# Patient Record
Sex: Male | Born: 1980 | Race: White | Hispanic: No | Smoking: Never smoker
Health system: Southern US, Community
[De-identification: ages and names within clinical notes are randomized; demographics above are authoritative.]

## PROBLEM LIST (undated history)

## (undated) DIAGNOSIS — I10 Essential (primary) hypertension: Secondary | ICD-10-CM

## (undated) DIAGNOSIS — R61 Generalized hyperhidrosis: Secondary | ICD-10-CM

## (undated) HISTORY — DX: Essential (primary) hypertension: I10

## (undated) HISTORY — DX: Generalized hyperhidrosis: R61

## (undated) HISTORY — PX: VASECTOMY: SHX75

---

## 1999-03-13 HISTORY — PX: OTHER SURGICAL HISTORY: SHX169

## 2001-01-10 ENCOUNTER — Encounter: Payer: Self-pay | Admitting: Family Medicine

## 2001-01-10 ENCOUNTER — Ambulatory Visit (HOSPITAL_COMMUNITY): Admission: RE | Admit: 2001-01-10 | Discharge: 2001-01-10 | Payer: Self-pay | Admitting: Family Medicine

## 2001-02-27 ENCOUNTER — Ambulatory Visit (HOSPITAL_BASED_OUTPATIENT_CLINIC_OR_DEPARTMENT_OTHER): Admission: RE | Admit: 2001-02-27 | Discharge: 2001-02-27 | Payer: Self-pay | Admitting: Orthopedic Surgery

## 2004-03-14 ENCOUNTER — Ambulatory Visit: Payer: Self-pay | Admitting: Internal Medicine

## 2004-12-08 ENCOUNTER — Ambulatory Visit: Payer: Self-pay | Admitting: Internal Medicine

## 2006-01-03 ENCOUNTER — Ambulatory Visit: Payer: Self-pay | Admitting: Internal Medicine

## 2013-06-08 ENCOUNTER — Other Ambulatory Visit: Payer: Self-pay | Admitting: Family Medicine

## 2013-06-08 DIAGNOSIS — E041 Nontoxic single thyroid nodule: Secondary | ICD-10-CM

## 2013-06-10 ENCOUNTER — Other Ambulatory Visit: Payer: Self-pay

## 2013-06-11 ENCOUNTER — Ambulatory Visit
Admission: RE | Admit: 2013-06-11 | Discharge: 2013-06-11 | Disposition: A | Discharge status: Home / Self Care | Payer: BC Managed Care – PPO | Source: Ambulatory Visit | Attending: Family Medicine | Admitting: Family Medicine

## 2013-06-11 DIAGNOSIS — E041 Nontoxic single thyroid nodule: Secondary | ICD-10-CM

## 2013-06-15 ENCOUNTER — Other Ambulatory Visit: Payer: Self-pay | Admitting: Family Medicine

## 2013-06-15 DIAGNOSIS — E041 Nontoxic single thyroid nodule: Secondary | ICD-10-CM

## 2013-06-23 ENCOUNTER — Ambulatory Visit
Admission: RE | Admit: 2013-06-23 | Discharge: 2013-06-23 | Disposition: A | Discharge status: Home / Self Care | Payer: BC Managed Care – PPO | Source: Ambulatory Visit | Attending: Family Medicine | Admitting: Family Medicine

## 2013-06-23 ENCOUNTER — Other Ambulatory Visit (HOSPITAL_COMMUNITY)
Admission: RE | Admit: 2013-06-23 | Discharge: 2013-06-23 | Disposition: A | Discharge status: Home / Self Care | Payer: BC Managed Care – PPO | Source: Ambulatory Visit | Attending: Interventional Radiology | Admitting: Interventional Radiology

## 2013-06-23 DIAGNOSIS — E041 Nontoxic single thyroid nodule: Secondary | ICD-10-CM | POA: Insufficient documentation

## 2015-04-03 IMAGING — US US SOFT TISSUE HEAD/NECK
1 series · 13 of 25 positions shown · non-contrast
Comparison: None.

CLINICAL DATA: Palpable area in the right neck clinically

EXAM:
THYROID ULTRASOUND
TECHNIQUE: Ultrasound examination of the thyroid gland and adjacent soft
tissues was performed.

[Series 1: us soft tissue head/neck · 0.06mm/px · 13 of 46 slices shown]
[im 1/46]
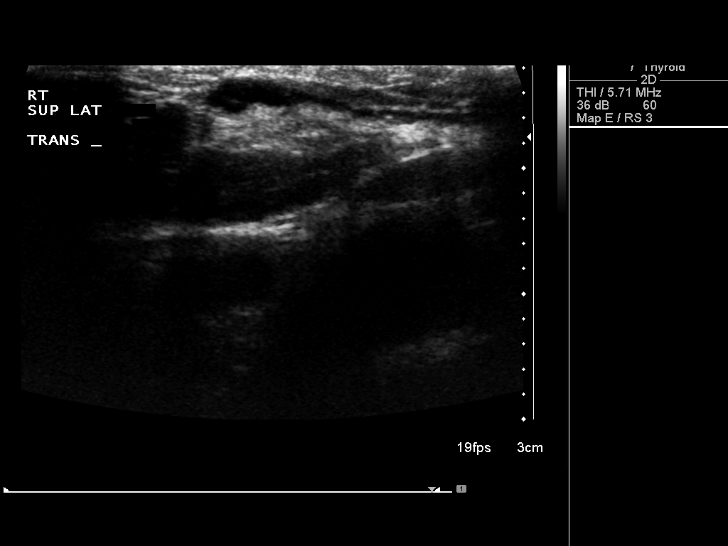
[im 4/46]
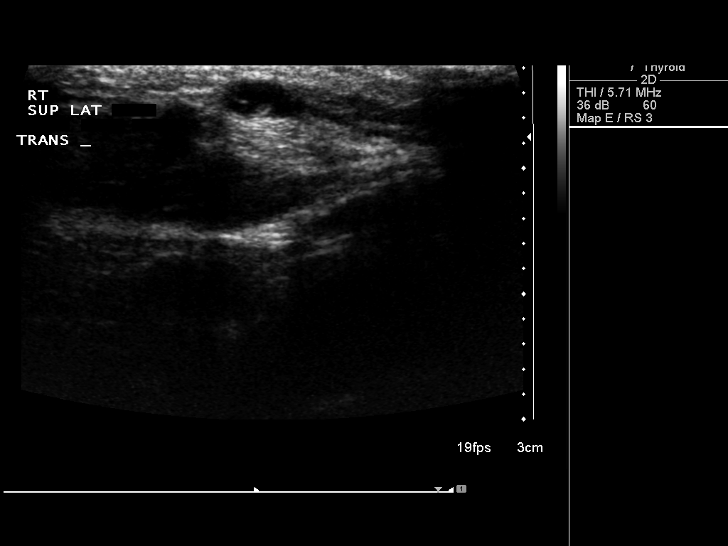
[im 8/46]
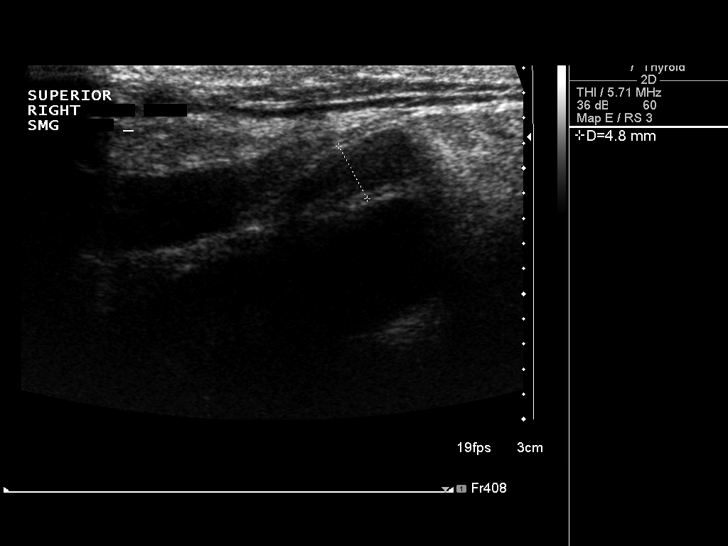
[im 12/46]
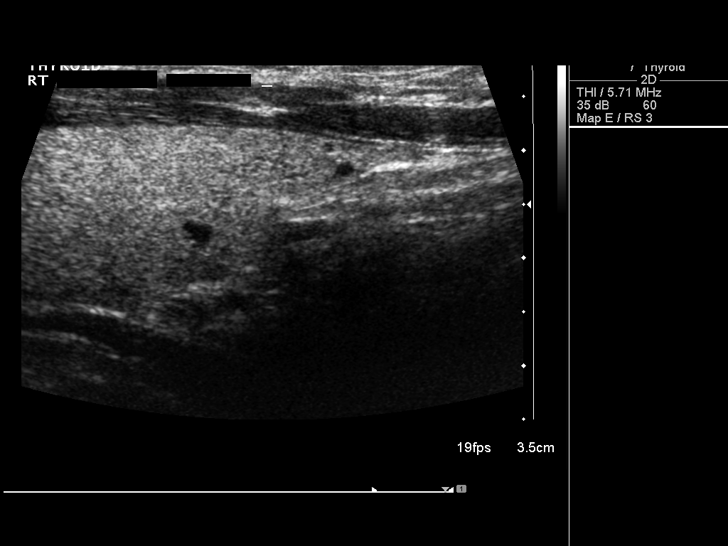
[im 16/46]
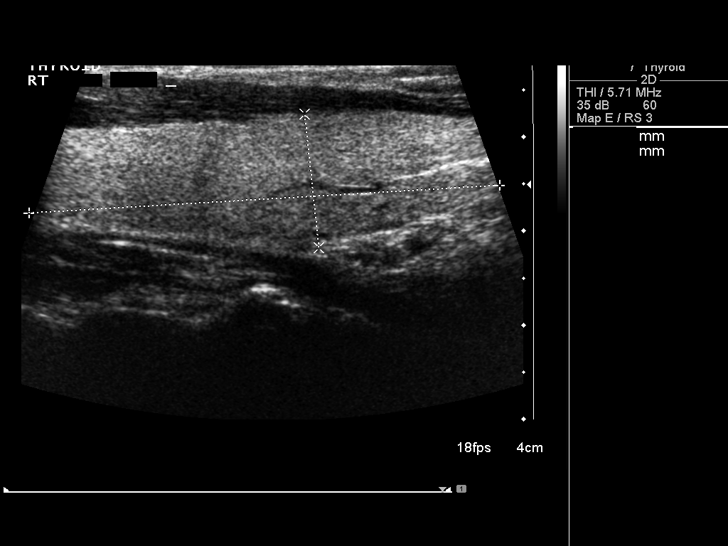
[im 19/46]
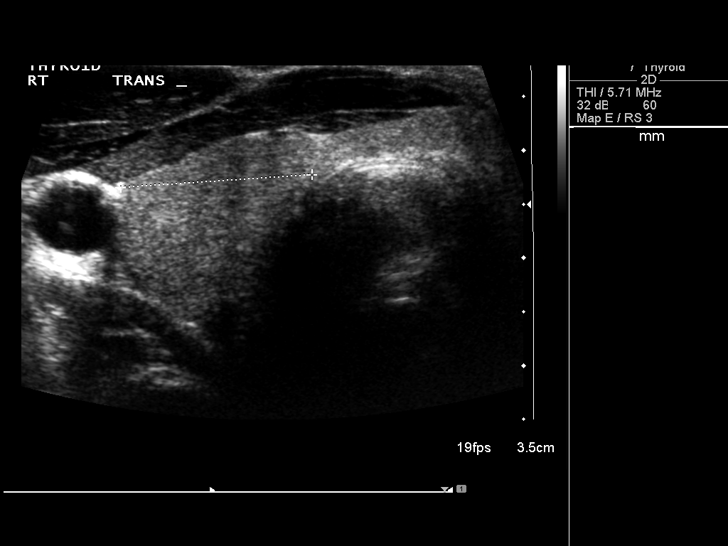
[im 23/46]
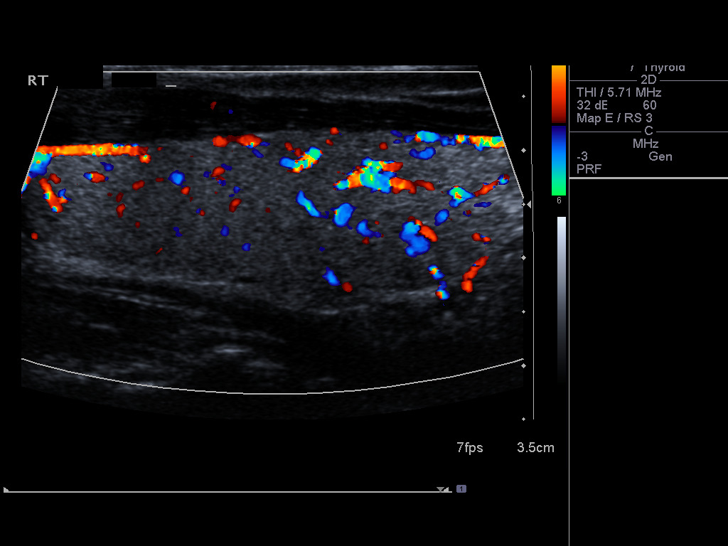
[im 27/46]
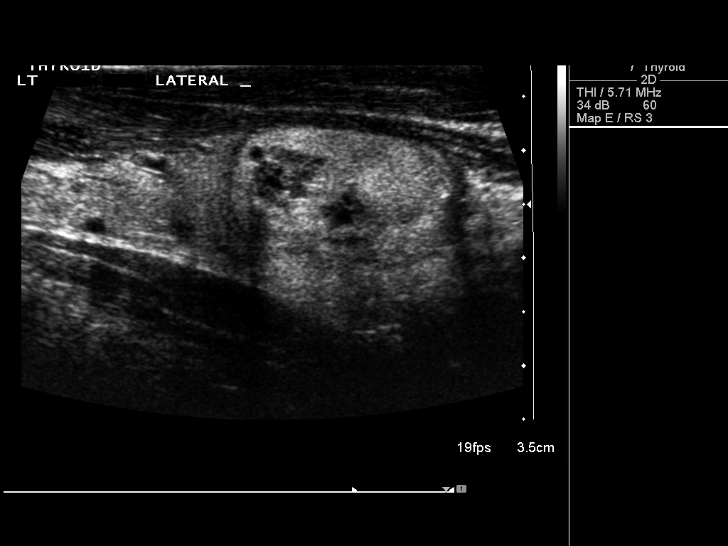
[im 31/46]
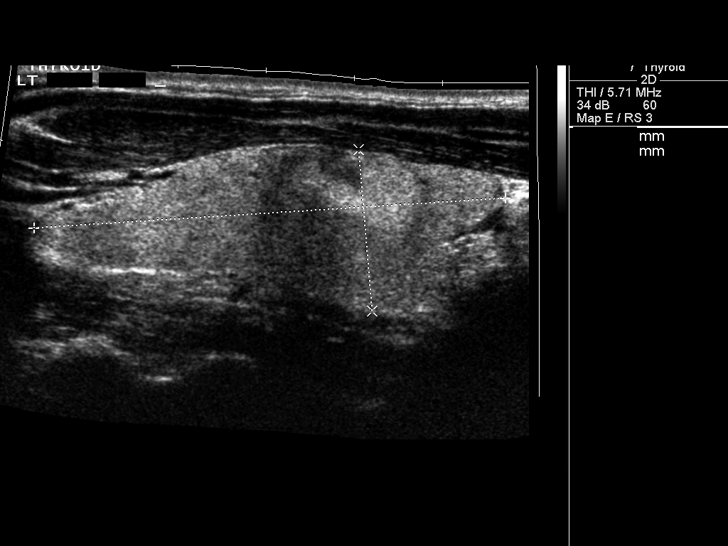
[im 34/46]
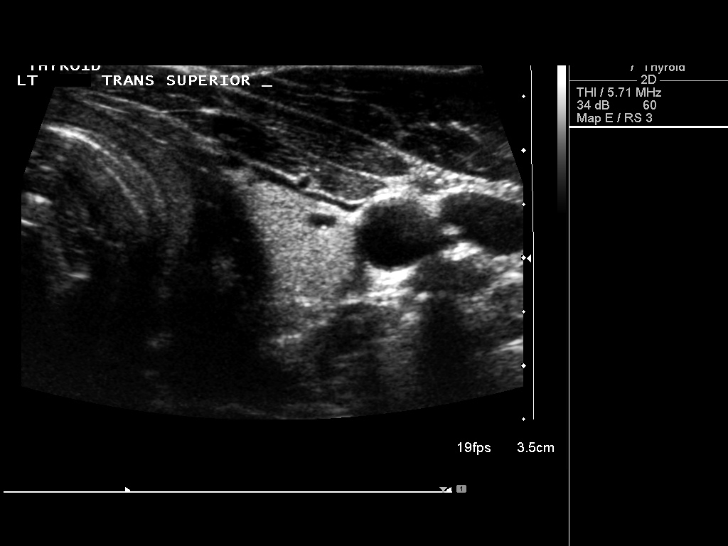
[im 38/46]
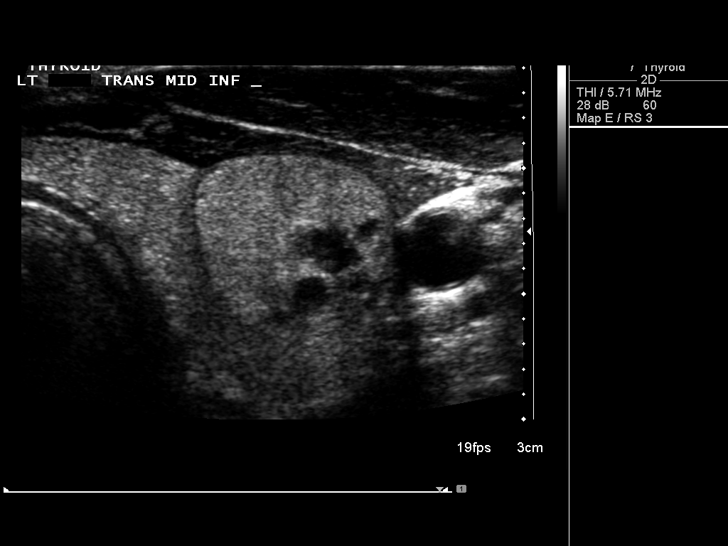
[im 42/46]
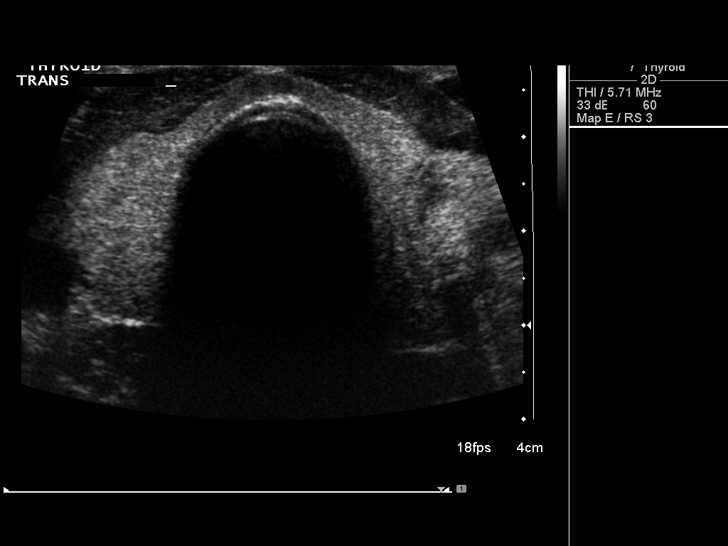
[im 46/46]
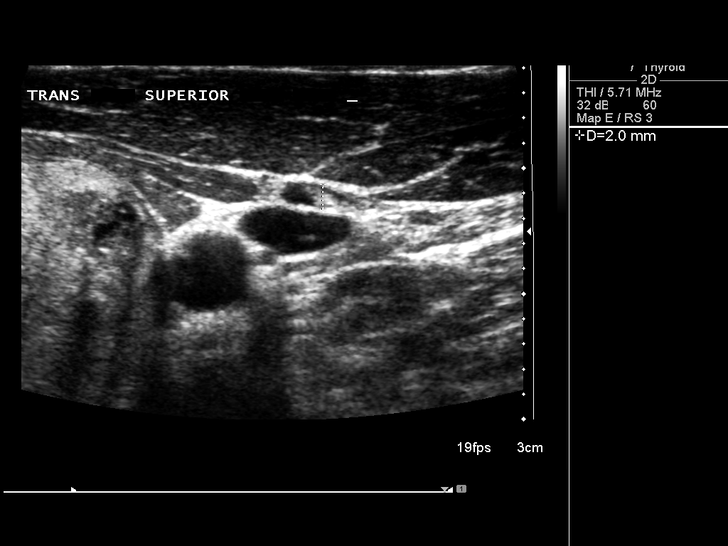

[13 of 25 positions shown; findings below may reference images not displayed]

FINDINGS: Right thyroid lobe

Measurements: 5.3 x 1.8 x 1.8 cm.. No definite right thyroid nodule
is seen.

Left thyroid lobe

Measurements: 5.3 x 1.8 x 2.2 cm.. A complex somewhat inhomogeneous
nodule which is primarily solid is present within the left mid lower
lobe of 2.3 x 1.5 x 1.6 cm. Biopsy of this nodule is recommended.

Isthmus

Thickness: 4 mm in thickness..  No nodules visualized.

Lymphadenopathy

At the site questioned clinically in the right superior lateral neck
there is a lymph node present of 3.8 mm in short axis diameter with
no pathological features.
IMPRESSION: 1. Complex primarily solid inhomogeneous nodule within the left lobe
of thyroid of 2.3 cm. Findings meet consensus criteria for biopsy.
Ultrasound-guided fine needle aspiration should be considered, as
per the consensus statement: Management of Thyroid Nodules Detected
at US: Society of Radiologists in Ultrasound Consensus Conference
2. At the site questioned clinically only a small relatively normal
appearing lymph node is present in the right superior lateral neck.

## 2015-04-15 IMAGING — US US THYROID BIOPSY
1 series · 12 of 12 positions shown · non-contrast
Comparison: None.

CLINICAL DATA: Dominant left nodule

EXAM:
ULTRASOUND GUIDED NEEDLE ASPIRATE BIOPSY OF THE THYROID GLAND

[Series 1: us thyroid biopsy · 0.07mm/px · 12 acquisitions, 12 frames shown]
[im 1/12]
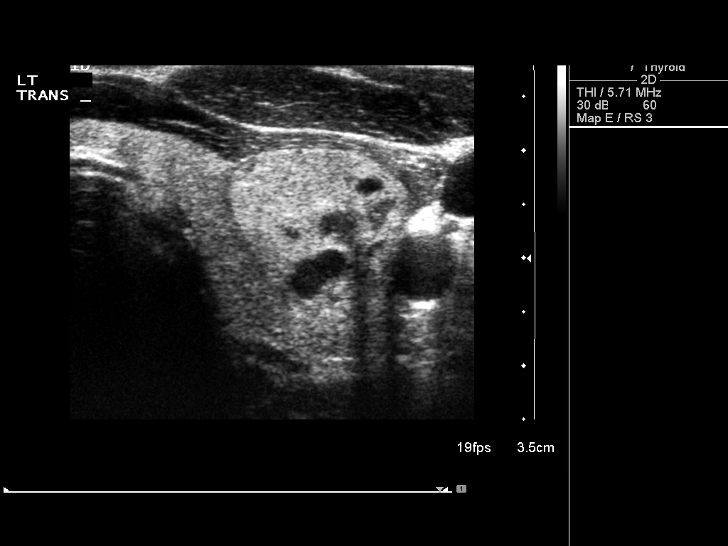
[im 2/12]
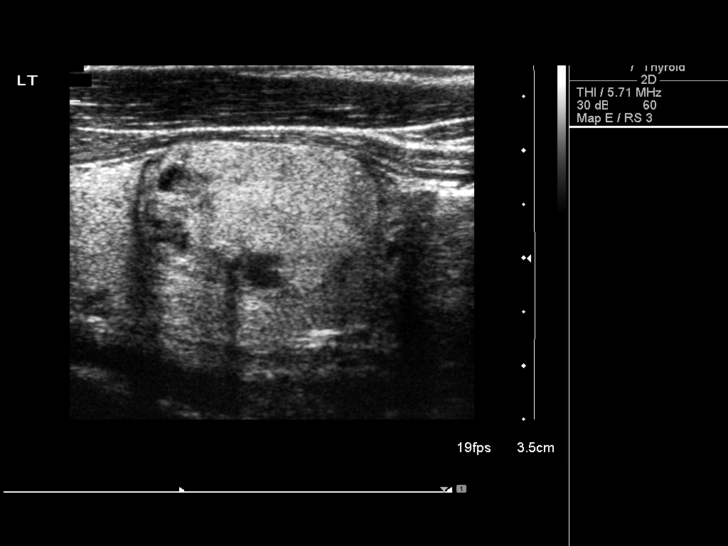
[im 3/12]
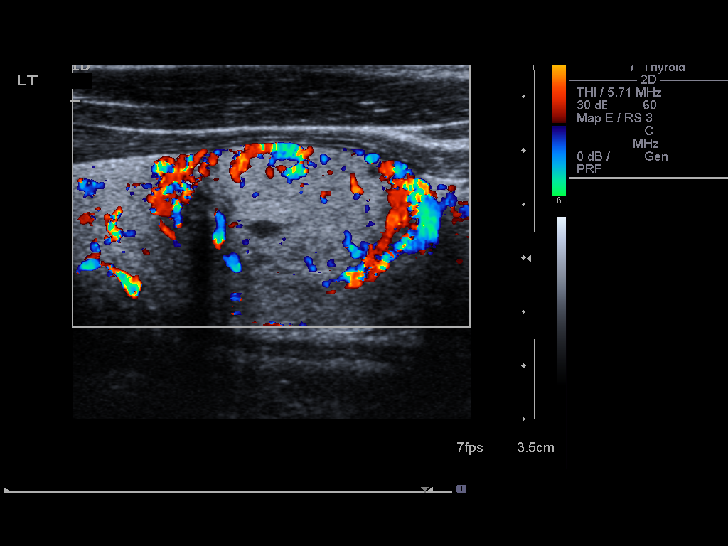
[im 4/12]
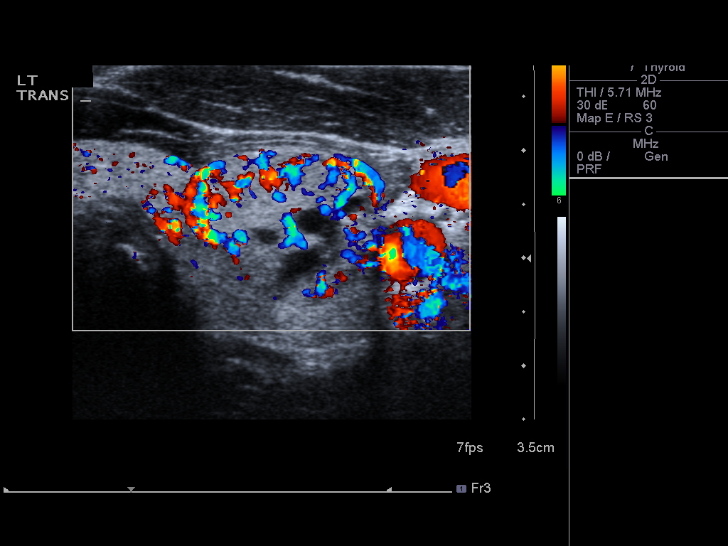
[im 5/12]
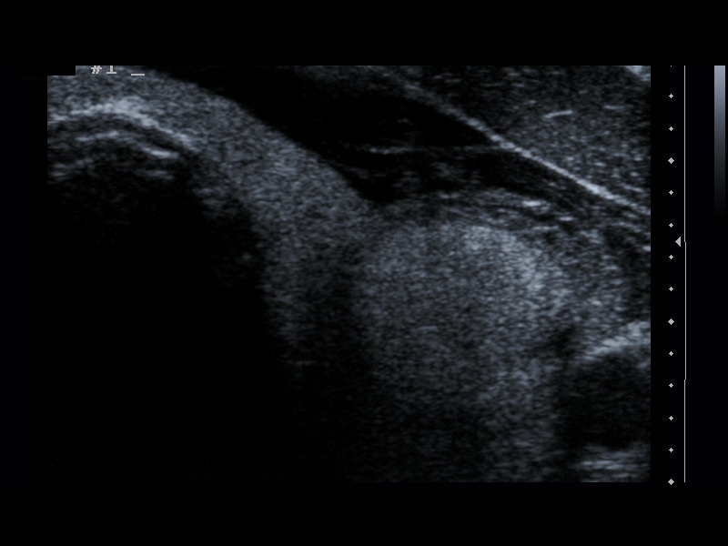
[im 6/12]
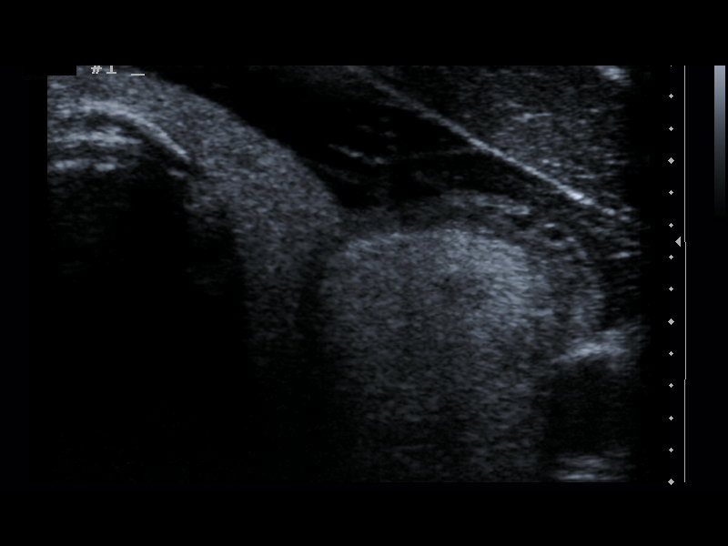
[im 7/12]
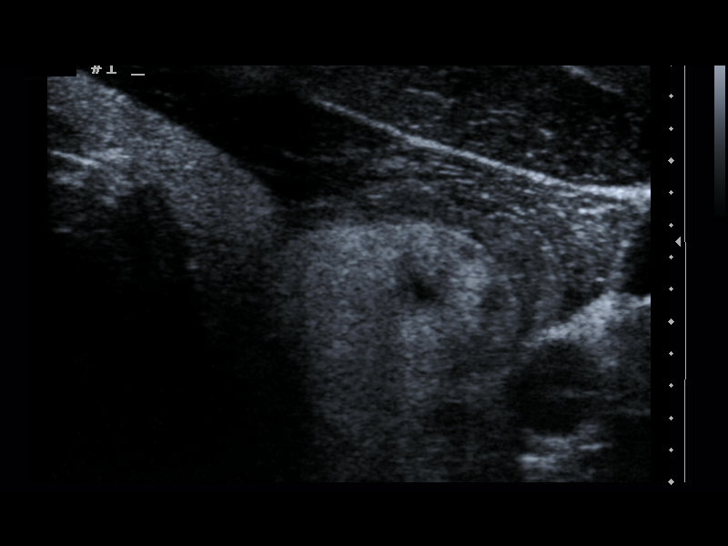
[im 8/12]
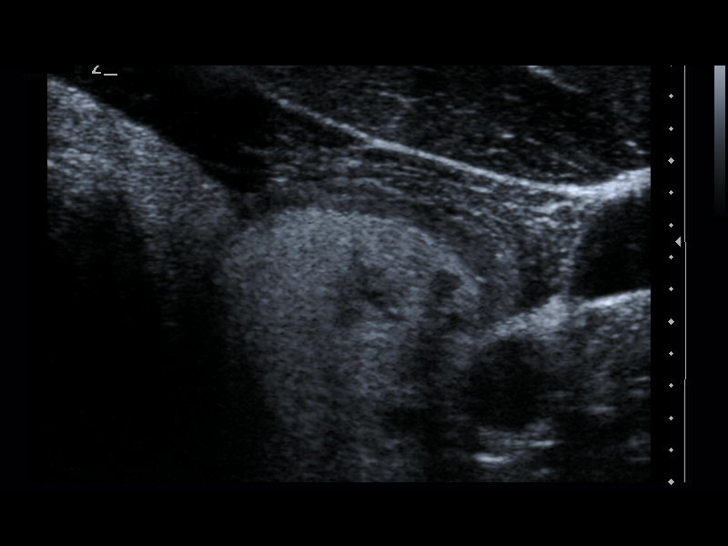
[im 9/12]
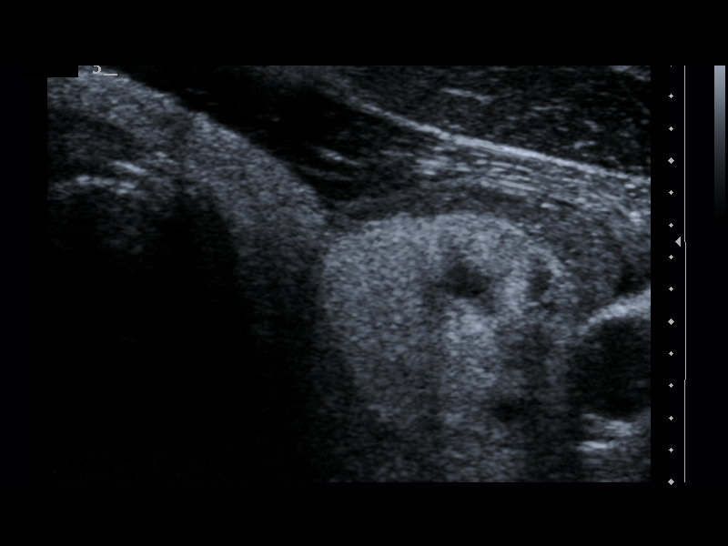
[im 10/12]
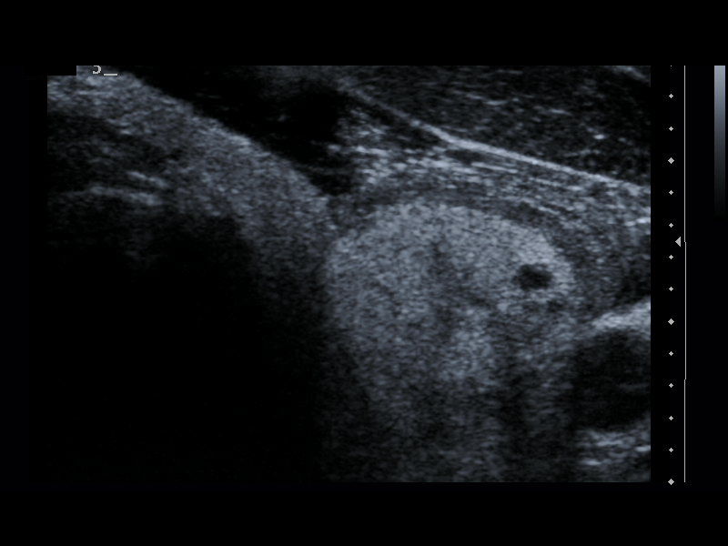
[im 11/12]
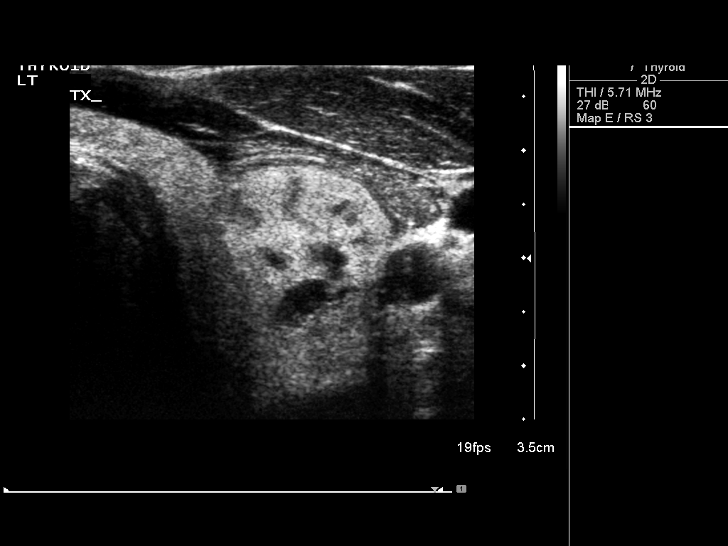
[im 12/12]
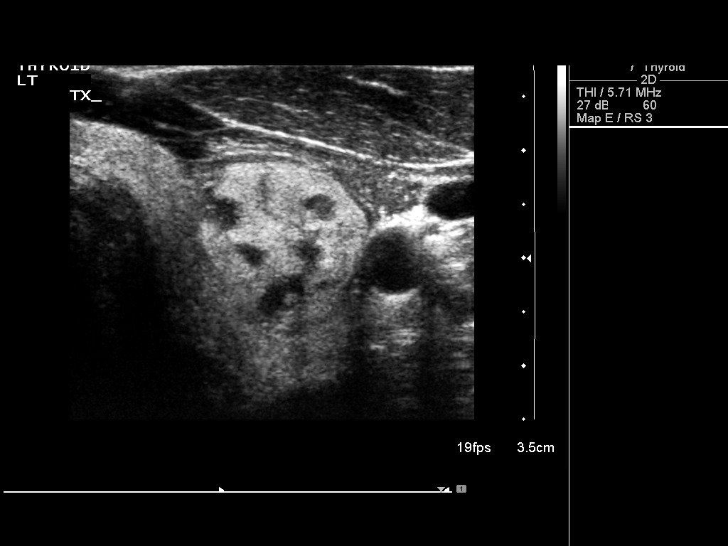

[12 of 12 positions shown; findings below may reference images not displayed]

PROCEDURE:
Thyroid biopsy was thoroughly discussed with the patient and
questions were answered. The benefits, risks, alternatives, and
complications were also discussed. The patient understands and
wishes to proceed with the procedure. Written consent was obtained.

Ultrasound was performed to localize and mark an adequate site for
the biopsy. The patient was then prepped and draped in a normal
sterile fashion. Local anesthesia was provided with 1% lidocaine.
Using direct ultrasound guidance, 3 passes were made using needles
into the nodule within the left lobe of the thyroid. Ultrasound was
used to confirm needle placements on all occasions. Specimens were
sent to Pathology for analysis.

Complications:  None
FINDINGS: Images document needle placement in the left thyroid nodule.
IMPRESSION: Ultrasound guided needle aspirate biopsy performed of the left
thyroid nodule.

## 2016-08-07 ENCOUNTER — Telehealth: Payer: Self-pay

## 2016-08-07 NOTE — Telephone Encounter (Signed)
Notes sent to scheduling.   

## 2016-08-10 ENCOUNTER — Encounter: Payer: Self-pay | Admitting: Cardiology

## 2016-08-10 ENCOUNTER — Encounter (INDEPENDENT_AMBULATORY_CARE_PROVIDER_SITE_OTHER): Payer: Self-pay

## 2016-08-10 ENCOUNTER — Ambulatory Visit (INDEPENDENT_AMBULATORY_CARE_PROVIDER_SITE_OTHER): Payer: BLUE CROSS/BLUE SHIELD | Admitting: Cardiology

## 2016-08-10 VITALS — BP 140/84 | HR 63 | Ht 68.25 in | Wt 216.0 lb

## 2016-08-10 DIAGNOSIS — E7800 Pure hypercholesterolemia, unspecified: Secondary | ICD-10-CM

## 2016-08-10 DIAGNOSIS — R0789 Other chest pain: Secondary | ICD-10-CM | POA: Diagnosis not present

## 2016-08-10 DIAGNOSIS — E669 Obesity, unspecified: Secondary | ICD-10-CM

## 2016-08-10 NOTE — Patient Instructions (Signed)
Medication Instructions:  The current medical regimen is effective;  continue present plan and medications.  Testing/Procedures: Your physician has requested that you have an exercise tolerance test. For further information please visit [email or web address]. Please also follow instruction sheet, as given.  Follow-Up: Follow up will be based on the results of the above testing.  Thank you for choosing [facility]!!     

## 2016-08-10 NOTE — Progress Notes (Addendum)
Cardiology Office Note:    Date:  08/10/2016   ID:  Erik King, DOB [birth year], MRN [id number]  PCP:  Shirlean MylarWebb, Carol, MD  Cardiologist:  Erik SchultzMark Trevone Prestwood, MD    Referring MD: Shirlean MylarWebb, Carol, MD     History of Present Illness:    Erik King is a 36 y.o. male is here for evaluation of chest pain at the request of Dr. Hyman King  In review of notes from 08/03/16, he was concerned about the possibility of having heart disease. He feels some fluttering feelings in his left chest. Weird feeling, left chest not constant, piecing pressure, feel something. It can happen when he sitting at his desk, working on the computer, doing the dishes. Sometimes it just is nagging. He can point to the spot on his chest where he feels this. Boss, head of [employer], Mr. Erik CloudKevin King at age 89 had widow maker encouraged him to make sure he is safe. He has been under increased stress, wife has been battling an illness.  Father at 6564 had afib 4 years ago. Grandfather MI died later in life.   He is a nonsmoker. Has 4 children. Normal kidney liver and electrolytes noted. LDL 110, has been taking Crestor. He is also been treated with lisinopril for high blood pressure.  Past Medical History:  Diagnosis Date  . Hyperhidrosis   . Hypertension     Past Surgical History:  Procedure Laterality Date  . RIGHT ANKLE SURGERY Right 2001  . VASECTOMY      Current Medications: Current Meds  Medication Sig  . aluminum chloride (DRYSOL) 20 % external solution Apply topically at bedtime.  . clindamycin (CLEOCIN T) 1 % SWAB Apply topically 2 (two) times daily.  . clobetasol cream (TEMOVATE) 0.05 % Apply 1 application topically daily as needed.  Marland Kitchen. lisinopril (PRINIVIL,ZESTRIL) 40 MG tablet Take 40 mg by mouth daily.  Marland Kitchen. omeprazole (PRILOSEC) 40 MG capsule Take 40 mg by mouth daily.  . rosuvastatin (CRESTOR) 10 MG tablet Take 10 mg by mouth daily.     Allergies:   Hctz [hydrochlorothiazide]; Metoprolol; and Neosporin  [neomycin-bacitracin zn-polymyx]   Social History   Social History  . Marital status: Married    Spouse name: N/A  . Number of children: N/A  . Years of education: N/A   Social History Main Topics  . Smoking status: Never Smoker  . Smokeless tobacco: Never Used  . Alcohol use Yes     Comment: 6-10 DRINKS PER WEEK  . Drug use: Unknown  . Sexual activity: Not Asked     Comment: MARRIED   Other Topics Concern  . None   Social History Narrative  . None     Family History: The patient's family history includes Cancer - Prostate in his father; Diabetes in his paternal grandfather; GI problems in his brother; Hyperlipidemia in his father; Hypertension in his father, mother, and paternal grandfather. ROS:   Please see the history of present illness.     All other systems reviewed and are negative.  EKGs/Labs/Other Studies Reviewed:    The following studies were reviewed today: Prior office notes, labs, EKG reviewed  EKG:  EKG is  ordered today.  The ekg ordered today demonstrates sinus rhythm 63 with no other abnormalities personally viewed  Recent Labs: No results found for requested labs within last 8760 hours.   Recent Lipid Panel No results found for: CHOL, TRIG, HDL, CHOLHDL, VLDL, LDLCALC, LDLDIRECT  Physical Exam:    VS:  BP 140/84   Pulse 63   Ht 5' 8.25" (1.734 m)   Wt 216 lb (98 kg)   BMI 32.60 kg/m     Wt Readings from Last 3 Encounters:  08/10/16 216 lb (98 kg)     GEN:  Well nourished, well developed in no acute distress HEENT: Normal NECK: No JVD; No carotid bruits LYMPHATICS: No lymphadenopathy CARDIAC: RRR, no murmurs, rubs, gallops RESPIRATORY:  Clear to auscultation without rales, wheezing or rhonchi  ABDOMEN: Soft, non-tender, non-distended, overweight MUSCULOSKELETAL:  No edema; No deformity  SKIN: Warm and dry NEUROLOGIC:  Alert and oriented x 3 PSYCHIATRIC:  Normal affect   ASSESSMENT:    1. Atypical chest pain   2. Pure  hypercholesterolemia   3. Class 1 obesity without serious comorbidity in adult, unspecified BMI, unspecified obesity type    PLAN:    In order of problems listed above:  1. Atypical chest pain  - Could be musculoskeletal given the sharp atypical nature. He has battled GERD in the past and omeprazole if stopped for 4 days give some epigastric discomfort which is different from his current pain. We will go ahead and order a treadmill test for further evaluation.  Hyperlipidemia  - Excellent use of Crestor 10 mg.  Essential hypertension  - On lisinopril 40. Blood pressure has been 130/90 mostly at home. May require HCTZ 12.5 mg in addition, however it looks like he had side effects with this medication previously. Ultimately, weight loss would be most helpful. He has been down to 175 pounds. He is run 2 marathons last which when his wife was pregnant. He was off of the medication at that time.   Medication Adjustments/Labs and Tests Ordered: Current medicines are reviewed at length with the patient today.  Concerns regarding medicines are outlined above. Labs and tests ordered and medication changes are outlined in the patient instructions below:  Patient Instructions  Medication Instructions:  The current medical regimen is effective;  continue present plan and medications.  Testing/Procedures: Your physician has requested that you have an exercise tolerance test. For further information please visit [email or web address] Please also follow instruction sheet, as given.  Follow-Up: Follow up will be based on the results of the above testing.  Thank you for choosing [facility]!!        Signed, Erik Schultz, MD  08/10/2016 3:03 PM    [facility]

## 2016-08-16 ENCOUNTER — Ambulatory Visit (INDEPENDENT_AMBULATORY_CARE_PROVIDER_SITE_OTHER): Payer: BLUE CROSS/BLUE SHIELD

## 2016-08-16 DIAGNOSIS — R0789 Other chest pain: Secondary | ICD-10-CM | POA: Diagnosis not present

## 2016-08-16 LAB — EXERCISE TOLERANCE TEST
Estimated workload: 14.1 METS
Exercise duration (min): 12 min
Exercise duration (sec): 30 s
MPHR: 183 {beats}/min
Peak HR: 171 {beats}/min
Percent HR: 93 %
RPE: 17
Rest HR: 65 {beats}/min

## 2017-12-10 ENCOUNTER — Ambulatory Visit: Payer: BLUE CROSS/BLUE SHIELD | Admitting: Neurology

## 2017-12-10 ENCOUNTER — Encounter

## 2019-06-17 ENCOUNTER — Telehealth: Payer: Self-pay | Admitting: Cardiology

## 2019-06-17 ENCOUNTER — Ambulatory Visit: Payer: PRIVATE HEALTH INSURANCE | Attending: Internal Medicine

## 2019-06-17 DIAGNOSIS — Z20822 Contact with and (suspected) exposure to covid-19: Secondary | ICD-10-CM

## 2019-06-17 NOTE — Telephone Encounter (Signed)
New message     Patient has an appointment for this Friday.  He is to be seen for chest pain.  His 24yr old daughter tested positive for COVID and he said he had his 2nd COVID vaccine 2 weeks ago.  Calling to talk to the nurse to see if he can have a virtual visit since it is a chest pain or what will Dr Anne Fu suggest.

## 2019-06-17 NOTE — Telephone Encounter (Signed)
I spoke to the patient who has an OV on 4/9 with Dr Anne Fu.  He wanted [place] to know that he had his 2nd CoVid vaccine 2 weeks ago, but his daughter has tested positive today 4/7.    He would like to know if he can still come in or should we do a Virtual visit in place.  Please call to discuss.

## 2019-06-18 LAB — SARS-COV-2, NAA 2 DAY TAT

## 2019-06-18 LAB — NOVEL CORONAVIRUS, NAA: SARS-CoV-2, NAA: NOT DETECTED

## 2019-06-18 NOTE — Telephone Encounter (Signed)
OK per Dr Anne Fu - pt is aware.    Patient Consent for Virtual Visit         Erik King has provided verbal consent on 06/18/2019 for a virtual visit (video or telephone).   CONSENT FOR VIRTUAL VISIT FOR:  Erik King  By participating in this virtual visit I agree to the following:  I hereby voluntarily request, consent and authorize [facility] and its employed or contracted physicians, physician assistants, nurse practitioners or other licensed health care professionals (the Practitioner), to provide me with telemedicine health care services (the "Services") as deemed necessary by the treating Practitioner. I acknowledge and consent to receive the Services by the Practitioner via telemedicine. I understand that the telemedicine visit will involve communicating with the Practitioner through live audiovisual communication technology and the disclosure of certain medical information by electronic transmission. I acknowledge that I have been given the opportunity to request an in-person assessment or other available alternative prior to the telemedicine visit and am voluntarily participating in the telemedicine visit.  I understand that I have the right to withhold or withdraw my consent to the use of telemedicine in the course of my care at any time, without affecting my right to future care or treatment, and that the Practitioner or I may terminate the telemedicine visit at any time. I understand that I have the right to inspect all information obtained and/or recorded in the course of the telemedicine visit and may receive copies of available information for a reasonable fee.  I understand that some of the potential risks of receiving the Services via telemedicine include:  Marland Kitchen Delay or interruption in medical evaluation due to technological equipment failure or disruption; . Information transmitted may not be sufficient (e.g. poor resolution of images) to allow for appropriate medical  decision making by the Practitioner; and/or  . In rare instances, security protocols could fail, causing a breach of personal health information.  Furthermore, I acknowledge that it is my responsibility to provide information about my medical history, conditions and care that is complete and accurate to the best of my ability. I acknowledge that Practitioner's advice, recommendations, and/or decision may be based on factors not within their control, such as incomplete or inaccurate data provided by me or distortions of diagnostic images or specimens that may result from electronic transmissions. I understand that the practice of medicine is not an exact science and that Practitioner makes no warranties or guarantees regarding treatment outcomes. I acknowledge that a copy of this consent can be made available to me via my patient portal [facility] MyChart), or I can request a printed copy by calling the office of [facility].    I understand that my insurance will be billed for this visit.   I have read or had this consent read to me. . I understand the contents of this consent, which adequately explains the benefits and risks of the Services being provided via telemedicine.  . I have been provided ample opportunity to ask questions regarding this consent and the Services and have had my questions answered to my satisfaction. . I give my informed consent for the services to be provided through the use of telemedicine in my medical care

## 2019-06-19 ENCOUNTER — Telehealth (INDEPENDENT_AMBULATORY_CARE_PROVIDER_SITE_OTHER): Payer: PRIVATE HEALTH INSURANCE | Admitting: Cardiology

## 2019-06-19 ENCOUNTER — Other Ambulatory Visit: Payer: Self-pay

## 2019-06-19 ENCOUNTER — Encounter: Payer: Self-pay | Admitting: Cardiology

## 2019-06-19 VITALS — BP 140/82 | HR 60 | Ht 68.25 in | Wt 221.0 lb

## 2019-06-19 DIAGNOSIS — I10 Essential (primary) hypertension: Secondary | ICD-10-CM | POA: Diagnosis not present

## 2019-06-19 DIAGNOSIS — R079 Chest pain, unspecified: Secondary | ICD-10-CM | POA: Diagnosis not present

## 2019-06-19 DIAGNOSIS — E7800 Pure hypercholesterolemia, unspecified: Secondary | ICD-10-CM | POA: Diagnosis not present

## 2019-06-19 MED ORDER — METOPROLOL TARTRATE 50 MG PO TABS: 50.0000 mg | ORAL_TABLET | Freq: Once | ORAL | 0 refills | Status: DC

## 2019-06-19 NOTE — Progress Notes (Signed)
Cardiology Office Note: Via video visit.  Consent obtained.  Performed secondary to COVID-19 pandemic.  Video platform utilized.   Date:  06/19/2019   ID:  Erik King, DOB 12-03-1980, MRN [id number]  PCP:  Maurice Small, MD  Cardiologist:  No primary care provider on file.  Electrophysiologist:  None   Referring MD: Maurice Small, MD     History of Present Illness:    Erik King is a 39 y.o. male here for the evaluation of chest pain at the request of Dr. Justin Mend.  Chest pain is left of center, pressing on it, deep pain. Can be with or without activity. Can feeling when sitting.  Moderate in severity.  Back in 2018, he saw me, stress test was performed and was overall low risk.  Since then, they have had 2 friends 1 of which had CABG at age 18 and another one at age 89 had severe coronary artery disease.  They are worried about his chest discomfort.  His mother also has high lipids, elevated LP(a).   His cholesterol has been challenging to control.  Last LDL they read to me was in the 150 range and this is been on Crestor 10 mg.  Back 3 years ago I had seen an LDL of 111 with Crestor 10.  He is trying to work on diet.  Does admit to enjoying red meat.  Blood pressure has also been a little challenging to control.  Past Medical History:  Diagnosis Date  . Hyperhidrosis   . Hypertension     Past Surgical History:  Procedure Laterality Date  . RIGHT ANKLE SURGERY Right 2001  . VASECTOMY      Current Medications: Current Meds  Medication Sig  . aluminum chloride (DRYSOL) 20 % external solution Apply topically at bedtime.  Marland Kitchen amLODipine (NORVASC) 5 MG tablet Take 5 mg by mouth daily.  . clindamycin (CLEOCIN T) 1 % SWAB Apply topically 2 (two) times daily.  . clobetasol cream (TEMOVATE) 5.73 % Apply 1 application topically daily as needed.  Marland Kitchen lisinopril (PRINIVIL,ZESTRIL) 40 MG tablet Take 40 mg by mouth daily.  Marland Kitchen omeprazole (PRILOSEC) 40 MG capsule Take 40 mg by mouth daily.  .  rosuvastatin (CRESTOR) 10 MG tablet Take 10 mg by mouth daily.  . sertraline (ZOLOFT) 50 MG tablet Take 50 mg by mouth daily.     Allergies:   Hctz [hydrochlorothiazide], Metoprolol, and Neosporin [neomycin-bacitracin zn-polymyx]   Social History   Socioeconomic History  . Marital status: Married    Spouse name: Not on file  . Number of children: Not on file  . Years of education: Not on file  . Highest education level: Not on file  Occupational History  . Not on file  Tobacco Use  . Smoking status: Never Smoker  . Smokeless tobacco: Never Used  Substance and Sexual Activity  . Alcohol use: Yes    Comment: 6-10 DRINKS PER WEEK  . Drug use: Not on file  . Sexual activity: Not on file    Comment: MARRIED  Other Topics Concern  . Not on file  Social History Narrative  . Not on file   Social Determinants of Health   Financial Resource Strain:   . Difficulty of Paying Living Expenses:   Food Insecurity:   . Worried About Charity fundraiser in the Last Year:   . Arboriculturist in the Last Year:   Transportation Needs:   . Film/video editor (Medical):   Marland Kitchen  Lack of Transportation (Non-Medical):   Physical Activity:   . Days of Exercise per Week:   . Minutes of Exercise per Session:   Stress:   . Feeling of Stress :   Social Connections:   . Frequency of Communication with Friends and Family:   . Frequency of Social Gatherings with Friends and Family:   . Attends Religious Services:   . Active Member of Clubs or Organizations:   . Attends Banker Meetings:   Marland Kitchen Marital Status:      Family History: The patient's family history includes Cancer - Prostate in his father; Diabetes in his paternal grandfather; GI problems in his brother; Hyperlipidemia in his father; Hypertension in his father, mother, and paternal grandfather.  ROS:   Please see the history of present illness.    No fevers chills nausea vomiting syncope bleeding all other systems  reviewed and are negative.  EKGs/Labs/Other Studies Reviewed:    The following studies were reviewed today: Prior office notes, stress test reviewed  EKG:  EKG is not ordered today.  From 2018 shows sinus rhythm with no other abnormalities.  Recent Labs: No results found for requested labs within last 8760 hours.  Recent Lipid Panel No results found for: CHOL, TRIG, HDL, CHOLHDL, VLDL, LDLCALC, LDLDIRECT  Physical Exam:    VS:  BP 140/82   Pulse 60   Ht 5' 8.25" (1.734 m)   Wt 221 lb (100.2 kg)   SpO2 98%   BMI 33.36 kg/m     Wt Readings from Last 3 Encounters:  06/19/19 221 lb (100.2 kg)  08/10/16 216 lb (98 kg)     General: Alert and oriented x3 In no acute distress Respiratory status appears stable able to complete full sentences without difficulty No significant edema appreciated. Moves all extremities.  ASSESSMENT:    1. Pure hypercholesterolemia   2. Chest pain, unspecified type   3. Essential hypertension    PLAN:    In order of problems listed above:  Chest discomfort -We will go ahead and proceed with coronary CT scan to get a definitive evaluation of his coronary anatomy.  We discussed possible treatment strategies following this. -Ultimately, I think his LDL needs to be lower for further intensive prevention.  I will wait and see what his CT scan shows [place].  Obviously if there is any evidence of atherosclerosis, we will intensify his statin dose and try to decrease his LDL to less than 70.  Essential hypertension -Overall still not optimized.  Amlodipine has been added since our last encounter in 2018.  Continue to work on this.  Hyperlipidemia -May have a familial component.  His mother has high lipids.  For now continue with Crestor 10.  See above for details.   Medication Adjustments/Labs and Tests Ordered: Current medicines are reviewed at length with the patient today.  Concerns regarding medicines are outlined above.  Orders Placed This  Encounter  Procedures  . CT CORONARY MORPH W/CTA COR W/SCORE W/CA W/CM &/OR WO/CM  . CT CORONARY FRACTIONAL FLOW RESERVE DATA PREP  . CT CORONARY FRACTIONAL FLOW RESERVE FLUID ANALYSIS   Meds ordered this encounter  Medications  . metoprolol tartrate (LOPRESSOR) 50 MG tablet    Sig: Take 1 tablet (50 mg total) by mouth once for 1 dose. Take one tablet 2 hours before your CT scan    Dispense:  1 tablet    Refill:  0    Patient Instructions  Medication Instructions:  The current  medical regimen is effective;  continue present plan and medications.  *If you need a refill on your cardiac medications before your next appointment, please call your pharmacy*  Testing/Procedures: Your physician has requested that you have cardiac CT. Cardiac computed tomography (CT) is a painless test that uses an x-ray machine to take clear, detailed pictures of your heart.   Follow-Up: At [facility], you and your health needs are our priority.  As part of our continuing mission to provide you with exceptional heart care, we have created designated Provider Care Teams.  These Care Teams include your primary Cardiologist (physician) and Advanced Practice Providers (APPs -  Physician Assistants and Nurse Practitioners) who all work together to provide you with the care you need, when you need it.  We recommend signing up for the patient portal called "MyChart".  Sign up information is provided on this After Visit Summary.  MyChart is used to connect with patients for Virtual Visits (Telemedicine).  Patients are able to view lab/test results, encounter notes, upcoming appointments, etc.  Non-urgent messages can be sent to your provider as well.   To learn more about what you can do with MyChart, go to [email or web address].    Your next appointment:   6 week(s)  The format for your next appointment:   In Person  Provider:   Donato Schultz, MD   Thank you for choosing [facility]!!     Your cardiac CT will be scheduled at:   [facility] [address] [place], [place] [address] [phone]  Please arrive at the [facility] main entrance of [facility] 30 minutes prior to test start time. Proceed to the [facility] Radiology Department (first floor) to check-in and test prep.  Please follow these instructions carefully (unless otherwise directed):  Hold all erectile dysfunction medications at least 3 days (72 hrs) prior to test.  On the Night Before the Test: . Be sure to Drink plenty of water. . Do not consume any caffeinated/decaffeinated beverages or chocolate 12 hours prior to your test. . Do not take any antihistamines 12 hours prior to your test.  On the Day of the Test: . Drink plenty of water. Do not drink any water within one hour of the test. . Do not eat any food 4 hours prior to the test. . You may take your regular medications prior to the test.  . Take metoprolol (Lopressor) two hours prior to test. . HOLD Furosemide/Hydrochlorothiazide morning of the test. . FEMALES- please wear underwire-free bra if available      After the Test: . Drink plenty of water. . After receiving IV contrast, you may experience a mild flushed feeling. This is normal. . On occasion, you may experience a mild rash up to 24 hours after the test. This is not dangerous. If this occurs, you can take Benadryl 25 mg and increase your fluid intake. . If you experience trouble breathing, this can be serious. If it is severe call 911 IMMEDIATELY. If it is mild, please call our office. . If you take any of these medications: Glipizide/Metformin, Avandament, Glucavance, please do not take 48 hours after completing test unless otherwise instructed.  Once we have confirmed authorization from your insurance company, we will call you to set up a date and time for your test.   For non-scheduling related questions, please contact the cardiac imaging nurse navigator  should you have any questions/concerns: Rockwell Alexandria, RN Navigator Cardiac Imaging [facility] [phone] office  For scheduling needs, including cancellations and rescheduling, please call [phone].       Signed, Donato Schultz, MD  06/19/2019 5:39 PM    [facility]

## 2019-06-19 NOTE — Patient Instructions (Addendum)
Medication Instructions:  The current medical regimen is effective;  continue present plan and medications.  *If you need a refill on your cardiac medications before your next appointment, please call your pharmacy*  Testing/Procedures: Your physician has requested that you have cardiac CT. Cardiac computed tomography (CT) is a painless test that uses an x-ray machine to take clear, detailed pictures of your heart.   Follow-Up: At [facility], you and your health needs are our priority.  As part of our continuing mission to provide you with exceptional heart care, we have created designated Provider Care Teams.  These Care Teams include your primary Cardiologist (physician) and Advanced Practice Providers (APPs -  Physician Assistants and Nurse Practitioners) who all work together to provide you with the care you need, when you need it.  We recommend signing up for the patient portal called "MyChart".  Sign up information is provided on this After Visit Summary.  MyChart is used to connect with patients for Virtual Visits (Telemedicine).  Patients are able to view lab/test results, encounter notes, upcoming appointments, etc.  Non-urgent messages can be sent to your provider as well.   To learn more about what you can do with MyChart, go to [email or web address].    Your next appointment:   6 week(s)  The format for your next appointment:   In Person  Provider:   Donato Schultz, MD   Thank you for choosing [facility]!!    Your cardiac CT will be scheduled at:   [facility] [address] [place], [place] [address] [phone]  Please arrive at the [facility] main entrance of [facility] 30 minutes prior to test start time. Proceed to the [facility] Radiology Department (first floor) to check-in and test prep.  Please follow these instructions carefully (unless otherwise directed):  Hold all erectile dysfunction medications at least 3 days  (72 hrs) prior to test.  On the Night Before the Test: . Be sure to Drink plenty of water. . Do not consume any caffeinated/decaffeinated beverages or chocolate 12 hours prior to your test. . Do not take any antihistamines 12 hours prior to your test.  On the Day of the Test: . Drink plenty of water. Do not drink any water within one hour of the test. . Do not eat any food 4 hours prior to the test. . You may take your regular medications prior to the test.  . Take metoprolol (Lopressor) two hours prior to test. . HOLD Furosemide/Hydrochlorothiazide morning of the test. . FEMALES- please wear underwire-free bra if available      After the Test: . Drink plenty of water. . After receiving IV contrast, you may experience a mild flushed feeling. This is normal. . On occasion, you may experience a mild rash up to 24 hours after the test. This is not dangerous. If this occurs, you can take Benadryl 25 mg and increase your fluid intake. . If you experience trouble breathing, this can be serious. If it is severe call 911 IMMEDIATELY. If it is mild, please call our office. . If you take any of these medications: Glipizide/Metformin, Avandament, Glucavance, please do not take 48 hours after completing test unless otherwise instructed.  Once we have confirmed authorization from your insurance company, we will call you to set up a date and time for your test.   For non-scheduling related questions, please contact the cardiac imaging nurse navigator should you have any questions/concerns: Rockwell Alexandria, RN Navigator Cardiac  Imaging [facility] [phone] office  For scheduling needs, including cancellations and rescheduling, please call [phone].

## 2019-07-16 ENCOUNTER — Telehealth (HOSPITAL_COMMUNITY): Payer: Self-pay | Admitting: Emergency Medicine

## 2019-07-16 MED ORDER — METOPROLOL TARTRATE 50 MG PO TABS: 50.0000 mg | ORAL_TABLET | Freq: Once | ORAL | 0 refills | Status: AC

## 2019-07-16 NOTE — Telephone Encounter (Signed)
Reaching out to patient to offer assistance regarding upcoming cardiac imaging study; pt verbalizes understanding of appt date/time, parking situation and where to check in, pre-test NPO status and medications ordered, and verified current allergies; name and call back number provided for further questions should they arise Rockwell Alexandria RN Navigator Cardiac Imaging [facility] [phone] office [phone] cell   Pt instructed to check HR with fitbit or wifes pulse ox 2 hr prior to test to ensure that he DOES / DOES NOT need to take PO metoprolol for HR control.  Pt appreciated the phone call.  Huntley Dec

## 2019-07-17 ENCOUNTER — Other Ambulatory Visit: Payer: Self-pay

## 2019-07-17 ENCOUNTER — Ambulatory Visit (HOSPITAL_COMMUNITY)
Admission: RE | Admit: 2019-07-17 | Discharge: 2019-07-17 | Disposition: A | Discharge status: Home / Self Care | Payer: PRIVATE HEALTH INSURANCE | Source: Ambulatory Visit | Attending: Cardiology | Admitting: Cardiology

## 2019-07-17 DIAGNOSIS — R079 Chest pain, unspecified: Secondary | ICD-10-CM | POA: Insufficient documentation

## 2019-07-17 MED ORDER — IOHEXOL 350 MG/ML SOLN
100.0000 mL | Freq: Once | INTRAVENOUS | Status: AC | PRN
  Administered 2019-07-17: 100 mL via INTRAVENOUS

## 2019-07-17 MED ORDER — NITROGLYCERIN 0.4 MG SL SUBL
SUBLINGUAL_TABLET | SUBLINGUAL | Status: AC
  Administered 2019-07-17: 0.8 mg
  Filled 2019-07-17: qty 2

## 2019-08-03 ENCOUNTER — Ambulatory Visit: Payer: Self-pay | Admitting: Cardiology

## 2019-12-02 ENCOUNTER — Other Ambulatory Visit: Payer: Self-pay | Admitting: Urology

## 2019-12-02 DIAGNOSIS — N5089 Other specified disorders of the male genital organs: Secondary | ICD-10-CM

## 2019-12-07 ENCOUNTER — Ambulatory Visit
Admission: RE | Admit: 2019-12-07 | Discharge: 2019-12-07 | Disposition: A | Discharge status: Home / Self Care | Payer: PRIVATE HEALTH INSURANCE | Source: Ambulatory Visit | Attending: Urology | Admitting: Urology

## 2019-12-07 DIAGNOSIS — N5089 Other specified disorders of the male genital organs: Secondary | ICD-10-CM

## 2020-06-01 ENCOUNTER — Ambulatory Visit (INDEPENDENT_AMBULATORY_CARE_PROVIDER_SITE_OTHER): Payer: 59 | Admitting: Psychology

## 2020-06-01 DIAGNOSIS — F419 Anxiety disorder, unspecified: Secondary | ICD-10-CM | POA: Diagnosis not present

## 2020-06-01 DIAGNOSIS — F909 Attention-deficit hyperactivity disorder, unspecified type: Secondary | ICD-10-CM | POA: Diagnosis not present

## 2020-06-01 DIAGNOSIS — Z630 Problems in relationship with spouse or partner: Secondary | ICD-10-CM

## 2020-06-22 ENCOUNTER — Ambulatory Visit (INDEPENDENT_AMBULATORY_CARE_PROVIDER_SITE_OTHER): Payer: 59 | Admitting: Psychology

## 2020-06-22 DIAGNOSIS — F1014 Alcohol abuse with alcohol-induced mood disorder: Secondary | ICD-10-CM

## 2020-06-22 DIAGNOSIS — F419 Anxiety disorder, unspecified: Secondary | ICD-10-CM | POA: Diagnosis not present

## 2020-06-22 DIAGNOSIS — Z630 Problems in relationship with spouse or partner: Secondary | ICD-10-CM

## 2020-07-06 ENCOUNTER — Ambulatory Visit (INDEPENDENT_AMBULATORY_CARE_PROVIDER_SITE_OTHER): Payer: 59 | Admitting: Psychology

## 2020-07-06 DIAGNOSIS — Z635 Disruption of family by separation and divorce: Secondary | ICD-10-CM

## 2020-07-06 DIAGNOSIS — F419 Anxiety disorder, unspecified: Secondary | ICD-10-CM

## 2020-07-13 ENCOUNTER — Ambulatory Visit (INDEPENDENT_AMBULATORY_CARE_PROVIDER_SITE_OTHER): Payer: 59 | Admitting: Psychology

## 2020-07-13 DIAGNOSIS — F419 Anxiety disorder, unspecified: Secondary | ICD-10-CM

## 2020-07-13 DIAGNOSIS — Z635 Disruption of family by separation and divorce: Secondary | ICD-10-CM

## 2020-07-20 ENCOUNTER — Ambulatory Visit (INDEPENDENT_AMBULATORY_CARE_PROVIDER_SITE_OTHER): Payer: 59 | Admitting: Psychology

## 2020-07-20 DIAGNOSIS — Z635 Disruption of family by separation and divorce: Secondary | ICD-10-CM

## 2020-07-20 DIAGNOSIS — F419 Anxiety disorder, unspecified: Secondary | ICD-10-CM | POA: Diagnosis not present

## 2020-07-28 ENCOUNTER — Ambulatory Visit (INDEPENDENT_AMBULATORY_CARE_PROVIDER_SITE_OTHER): Payer: 59 | Admitting: Psychology

## 2020-07-28 DIAGNOSIS — F419 Anxiety disorder, unspecified: Secondary | ICD-10-CM | POA: Diagnosis not present

## 2020-07-28 DIAGNOSIS — Z635 Disruption of family by separation and divorce: Secondary | ICD-10-CM | POA: Diagnosis not present

## 2020-08-03 ENCOUNTER — Ambulatory Visit (INDEPENDENT_AMBULATORY_CARE_PROVIDER_SITE_OTHER): Payer: 59 | Admitting: Psychology

## 2020-08-03 DIAGNOSIS — Z635 Disruption of family by separation and divorce: Secondary | ICD-10-CM

## 2020-08-03 DIAGNOSIS — F419 Anxiety disorder, unspecified: Secondary | ICD-10-CM

## 2020-08-09 ENCOUNTER — Ambulatory Visit (INDEPENDENT_AMBULATORY_CARE_PROVIDER_SITE_OTHER): Payer: 59 | Admitting: Psychology

## 2020-08-09 DIAGNOSIS — F419 Anxiety disorder, unspecified: Secondary | ICD-10-CM

## 2020-08-09 DIAGNOSIS — Z635 Disruption of family by separation and divorce: Secondary | ICD-10-CM

## 2020-09-30 ENCOUNTER — Ambulatory Visit (INDEPENDENT_AMBULATORY_CARE_PROVIDER_SITE_OTHER): Payer: 59 | Admitting: Psychology

## 2020-09-30 DIAGNOSIS — F419 Anxiety disorder, unspecified: Secondary | ICD-10-CM | POA: Diagnosis not present

## 2020-09-30 DIAGNOSIS — Z635 Disruption of family by separation and divorce: Secondary | ICD-10-CM | POA: Diagnosis not present

## 2020-10-11 ENCOUNTER — Ambulatory Visit (INDEPENDENT_AMBULATORY_CARE_PROVIDER_SITE_OTHER): Payer: 59 | Admitting: Psychology

## 2020-10-11 DIAGNOSIS — F419 Anxiety disorder, unspecified: Secondary | ICD-10-CM

## 2020-10-11 DIAGNOSIS — Z635 Disruption of family by separation and divorce: Secondary | ICD-10-CM | POA: Diagnosis not present

## 2020-10-26 ENCOUNTER — Ambulatory Visit (INDEPENDENT_AMBULATORY_CARE_PROVIDER_SITE_OTHER): Payer: 59 | Admitting: Psychology

## 2020-10-26 DIAGNOSIS — F419 Anxiety disorder, unspecified: Secondary | ICD-10-CM

## 2020-10-26 DIAGNOSIS — Z635 Disruption of family by separation and divorce: Secondary | ICD-10-CM

## 2020-11-09 ENCOUNTER — Ambulatory Visit (INDEPENDENT_AMBULATORY_CARE_PROVIDER_SITE_OTHER): Payer: 59 | Admitting: Psychology

## 2020-11-09 DIAGNOSIS — F419 Anxiety disorder, unspecified: Secondary | ICD-10-CM | POA: Diagnosis not present

## 2020-11-09 DIAGNOSIS — Z635 Disruption of family by separation and divorce: Secondary | ICD-10-CM

## 2020-11-23 ENCOUNTER — Ambulatory Visit (INDEPENDENT_AMBULATORY_CARE_PROVIDER_SITE_OTHER): Payer: 59 | Admitting: Psychology

## 2020-11-23 DIAGNOSIS — F419 Anxiety disorder, unspecified: Secondary | ICD-10-CM | POA: Diagnosis not present

## 2020-11-23 DIAGNOSIS — Z635 Disruption of family by separation and divorce: Secondary | ICD-10-CM | POA: Diagnosis not present

## 2020-12-07 ENCOUNTER — Ambulatory Visit (INDEPENDENT_AMBULATORY_CARE_PROVIDER_SITE_OTHER): Payer: 59 | Admitting: Psychology

## 2020-12-07 DIAGNOSIS — Z635 Disruption of family by separation and divorce: Secondary | ICD-10-CM

## 2020-12-07 DIAGNOSIS — F419 Anxiety disorder, unspecified: Secondary | ICD-10-CM

## 2020-12-21 ENCOUNTER — Ambulatory Visit (INDEPENDENT_AMBULATORY_CARE_PROVIDER_SITE_OTHER): Payer: 59 | Admitting: Psychology

## 2020-12-21 DIAGNOSIS — F419 Anxiety disorder, unspecified: Secondary | ICD-10-CM | POA: Diagnosis not present

## 2020-12-21 DIAGNOSIS — Z635 Disruption of family by separation and divorce: Secondary | ICD-10-CM

## 2021-01-04 ENCOUNTER — Ambulatory Visit (INDEPENDENT_AMBULATORY_CARE_PROVIDER_SITE_OTHER): Payer: 59 | Admitting: Psychology

## 2021-01-04 DIAGNOSIS — Z635 Disruption of family by separation and divorce: Secondary | ICD-10-CM

## 2021-01-04 DIAGNOSIS — F419 Anxiety disorder, unspecified: Secondary | ICD-10-CM | POA: Diagnosis not present

## 2021-01-24 ENCOUNTER — Ambulatory Visit (INDEPENDENT_AMBULATORY_CARE_PROVIDER_SITE_OTHER): Payer: 59 | Admitting: Psychology

## 2021-01-24 DIAGNOSIS — F419 Anxiety disorder, unspecified: Secondary | ICD-10-CM | POA: Diagnosis not present

## 2021-02-08 ENCOUNTER — Ambulatory Visit (INDEPENDENT_AMBULATORY_CARE_PROVIDER_SITE_OTHER): Payer: 59 | Admitting: Psychology

## 2021-02-08 DIAGNOSIS — F419 Anxiety disorder, unspecified: Secondary | ICD-10-CM | POA: Diagnosis not present

## 2021-02-21 ENCOUNTER — Ambulatory Visit (INDEPENDENT_AMBULATORY_CARE_PROVIDER_SITE_OTHER): Payer: 59 | Admitting: Psychology

## 2021-02-21 DIAGNOSIS — F4322 Adjustment disorder with anxiety: Secondary | ICD-10-CM | POA: Diagnosis not present

## 2021-02-21 NOTE — Progress Notes (Signed)
Progress Note Date: 02/21/2021  Diagnosis F43.22 (Adjustment Disorder, With anxiety) [n/a]  V61.10 (Relationship distress with spouse or intimate partner) [n/a]  Symptoms Excessive and/or unrealistic worry that is difficult to control occurring more days than not for at least 6 months about a number of events or activities. (Status: maintained) -- No Description Entered  Frequent or continual arguing with the partner. (Status: maintained) -- No Description Entered  Marital separation. (Status: maintained) -- No Description Entered  Pending divorce. (Status: maintained) -- No Description Entered  Medication Status compliance  Safety none  If Suicidal or Homicidal State Action Taken: unspecified  Current Risk: low Medications Zoloft (Dosage: 75mg )  Objectives Related Problem: Increase awareness of own role in the relationship conflicts. Description: Gain insight into how past relationship experiences influence current relationship problems. Target Date: 2021-03-20 Frequency: Daily Modality: individual Progress: 50%  Related Problem: Increase awareness of own role in the relationship conflicts. Description: Learn and implement problem-solving and conflict resolution skills. Target Date: 2021-03-20 Frequency: Daily Modality: individual Progress: 40%  Related Problem: Increase awareness of own role in the relationship conflicts. Description: Acknowledge the connection between substance abuse and the conflicts present within the relationship. Target Date: 2021-03-20 Frequency: Daily Modality: individual Progress: 80%  Related Problem: New Goal Statement for Anxiety Description: Maintain involvement in work, family, and social activities. Target Date: 2021-03-20 Frequency: Daily Modality: individual Progress: 50%  Related Problem: New Goal Statement for Anxiety Description: Learn and implement problem-solving strategies for realistically addressing worries. Target Date:  2021-03-20 Frequency: Daily Modality: individual Progress: 50%  Related Problem: New Goal Statement for Anxiety Description: Describe situations, thoughts, feelings, and actions associated with anxieties and worries, their impact on functioning, and attempts to resolve them. Target Date: 2021-03-20 Frequency: Daily Modality: individual Progress: 60%  Client Response full compliance  Service Location Location, 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 Alcohol abuse. Meds: 10mg  Adderall. Goals: He is seeking strategies to manage marital discord and associated anxiety. target 1-23. He is also wanting to better manage his alcohol intake and/or consider abstinence. target 1-23. Patient agrees to have a video Webex session due to the pandemic. He is at his office and I am in my home office. Alex: [id number] says his son has been staying with [name] since returning from the hospital. Mellody Life has tried to communicate with him, but [name] rejects him and will not talk to him. [name] has called Mellody Life, Emet's therapist, to seek guidance. Has not heard from her as of yet. Emet ended up coming over to Alex's house and it went well. He did tell [name] he wanted to spend the night at his mother's house. Alex does feel that Meredith Leeds is good around him unless [name] is around. He feels she is Mellody Life against him. His other son has been expressing his anger at his mother for "continuing to talk about the divorce". He and [name] go back to court on Jan. 17th. She is requesting that she have full custody of the children. [name] has not talked with Chas (attorney) about  the case and I suggested he reach out.  He and [id number] have not established plans for Christmas and she has declared that his parents are  not allowed to pick up the kids. Suggested that he work with [name] around what they both consider to be the best interest of the kids.  He says he continues to do a great job of self-care and feels the best physically he has in a long time. It has been 7 months since he and [name] have split up and he feels he is "over the hump". Session=50 minutes. 9:40-10:30  [name], PhD

## 2021-03-27 ENCOUNTER — Ambulatory Visit (INDEPENDENT_AMBULATORY_CARE_PROVIDER_SITE_OTHER): Payer: 59 | Admitting: Psychology

## 2021-03-27 DIAGNOSIS — F4322 Adjustment disorder with anxiety: Secondary | ICD-10-CM | POA: Diagnosis not present

## 2021-03-27 NOTE — Progress Notes (Signed)
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 (Status: maintained) -- No Description Entered  Frequent or continual arguing with the partner. (Status: maintained) -- No Description Entered  Marital separation. (Status: maintained) -- No Description Entered  Pending divorce. (Status: maintained) -- No Description Entered  Medication Status compliance  Safety none  If Suicidal or Homicidal State Action Taken: unspecified  Current Risk: low Medications Zoloft (Dosage: 69m)  Objectives Related Problem: Increase awareness of own role in the relationship conflicts. Description: Gain insight into how past relationship experiences influence current relationship problems. Target Date: 2021-03-20 Frequency: Daily Modality: individual Progress: 60%  Related Problem: Increase awareness of own role in the relationship conflicts. Description: Learn and implement problem-solving and conflict resolution skills. Target Date: 2021-03-20 Frequency: Daily Modality: individual Progress: 60%  Related Problem: Increase awareness of own role in the relationship conflicts. Description: Acknowledge the connection between substance abuse and the conflicts present within the relationship. Target Date: 2021-03-20 Frequency: Daily Modality: individual Progress: 90%  Related Problem: New Goal Statement for Anxiety Description: Maintain involvement in work, family, and social activities. Target Date: 2021-03-20 Frequency: Daily Modality: individual Progress: 90%  Related Problem: New Goal Statement for Anxiety Description: Learn and implement problem-solving strategies for realistically addressing worries. Target Date:  2021-03-20 Frequency: Daily Modality: individual Progress: 60%  Related Problem: New Goal Statement for Anxiety Description: Describe situations, thoughts, feelings, and actions associated with anxieties and worries, their impact on functioning, and attempts to resolve them. Target Date: 2021-03-20 Frequency: Daily Modality: individual Progress: 60%  Client Response full compliance  Service Location Location, 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 Alcohol abuse.  Meds: 166mAdderall.  Goals: He is seeking strategies to manage marital discord and associated anxiety. target 6-23. He is also wanting to better manage his alcohol intake and/or consider abstinence. Target 1-23.  Patient agrees to have a video Webex session due to the pandemic. He is at his office and I am in my home office.  Erik King: Erik King they go back to court tomorrow for custody. His wife wants the kids to stay with her with no provisions for visitation with him. His attorney tells him not to worry because her ask is extreme. His son Erik King be testifying about his desire to be with his father. His other son Erik Crewsas told him he should be giving Erik King money to support her. Erik King feels clear that Erik Hails sharing information with Erik King that puts Erik King a negative light. According to Erik King to talk with the children about the divorce and some of the specifics that are causing problems. Erik King to control alcohol intake, which is minimal. Las time he drank was Christmas Eve when he had 2 glasses of wine. He is doing "dry January". Also, he has  not had any alcohol in presence of the kids since they separated in May. He also takes breathalyzer tests every night he has the kids. He  continues to work out 5-6 times/week. Says he is in the best shape since he ran a marathon 10 years ago. He met a girl Erik King on a dating app a month ago and says she is one of the sweetest, genuine empathetic people he has met. She has three kids from a previous marriage. They have not yet met each others children. They met in person for the first time on Christmas morning. They have since met a number of times and he is feeling very good about their interactions. Limited to frequency of meeting due to their kid's schedules. He sees her as "sweet, caring and supportive", which he says is new for him. So far, he sees this as "easy" and finds it refreshing. He is excited about the relationship at this point. We talked about the need to "go slow". He agrees and will be cautious.           Session=50 minutes. 10:40-11:30  Marcelina Morel, PhD                Marcelina Morel, PhD

## 2021-04-13 ENCOUNTER — Ambulatory Visit (INDEPENDENT_AMBULATORY_CARE_PROVIDER_SITE_OTHER): Payer: 59 | Admitting: Psychology

## 2021-04-13 DIAGNOSIS — F4322 Adjustment disorder with anxiety: Secondary | ICD-10-CM

## 2021-04-13 NOTE — Progress Notes (Signed)
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 (Status: maintained) -- No Description Entered  Frequent or continual arguing with the partner. (Status: maintained) -- No Description Entered  Marital separation. (Status: maintained) -- No Description Entered  Pending divorce. (Status: maintained) -- No Description Entered  Medication Status compliance  Safety none  If Suicidal or Homicidal State Action Taken: unspecified  Current Risk: low Medications Zoloft (Dosage: 75mg )  Objectives Related Problem: Increase awareness of own role in the relationship conflicts. Description: Gain insight into how past relationship experiences influence current relationship problems. Target Date: 2021-08-18 Frequency: Daily Modality: individual Progress: 60%  Related Problem: Increase awareness of own role in the relationship conflicts. Description: Learn and implement problem-solving and conflict resolution skills. Target Date: 2021-08-18 Frequency: Daily Modality: individual Progress: 60%  Related Problem: Increase awareness of own role in the relationship conflicts. Description: Acknowledge the connection between substance abuse and the conflicts present within the relationship. Target Date: 2021-03-20 Frequency: Daily Modality: individual Progress: 100%  Related Problem: New Goal Statement for Anxiety Description: Maintain involvement in work, family, and social activities. Target Date: 2021-08-18 Frequency: Daily Modality: individual Progress: 90%  Related Problem: New Goal Statement for Anxiety Description: Learn and implement problem-solving strategies for realistically addressing worries. Target Date:  2021-08-18 Frequency: Daily Modality: individual Progress: 80%  Related Problem: New Goal Statement for Anxiety Description: Describe situations, thoughts, feelings, and actions associated with anxieties and worries, their impact on functioning, and attempts to resolve them. Target Date: 2021-08-18 Frequency: Daily Modality: individual Progress: 80%  Client Response full compliance  Service Location Location, 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 Alcohol abuse.  Meds: 10mg  Adderall.  Goals: He is seeking strategies to manage marital discord and associated anxiety. target 6-23. He is also wanting to better manage his alcohol intake and/or consider abstinence. Target 1-23. Completed  Patient agrees to have a video Webex session due to the pandemic. He is at his office and I am in my home office.  Erik King: Erik King says he is "fantastic". He had to go to court for the custody decision. The ruling was that there would be 50-50 custody and that his parents time alone with kids is 3 hours at any given time. He is feeling a tremendous relief and says the judges ruling is largely in his favor and based "on the facts". He says that his wife is angry, but he feels protected by the order.  He reports that he is still seeing the same woman he mentioned at the last session. He says that "she is the sweetest, most genuine woman he has ever hung out with. They are seeing each other more regularly. She is divorced with 3 kids (one at college). Every other weekend we are available to see each other. He says they have not introduced each other to each other's  kids. They speak daily and he says conversation is super easy. They have been talking for 6 weeks and he  is optimistic that this relationship has a future. He says their physical relationship is really good and gratifying. He reports having more confidence than ever before. He feels the most mentally and physically healthy than he has ever felt.                 Marcelina Morel, PhD Time: 1:05p-2:00p 55 min                Marcelina Morel, PhD               Marcelina Morel, PhD

## 2021-04-26 ENCOUNTER — Ambulatory Visit (INDEPENDENT_AMBULATORY_CARE_PROVIDER_SITE_OTHER): Payer: 59 | Admitting: Psychology

## 2021-04-26 DIAGNOSIS — F4322 Adjustment disorder with anxiety: Secondary | ICD-10-CM | POA: Diagnosis not present

## 2021-04-26 NOTE — Progress Notes (Signed)
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 (Status: maintained) -- No Description Entered  Frequent or continual arguing with the partner. (Status: maintained) -- No Description Entered  Marital separation. (Status: maintained) -- No Description Entered  Pending divorce. (Status: maintained) -- No Description Entered  Medication Status compliance  Safety none  If Suicidal or Homicidal State Action Taken: unspecified  Current Risk: low Medications Zoloft (Dosage: 75mg )  Objectives Related Problem: Increase awareness of own role in the relationship conflicts. Description: Gain insight into how past relationship experiences influence current relationship problems. Target Date: 2021-08-18 Frequency: Daily Modality: individual Progress: 60%  Related Problem: Increase awareness of own role in the relationship conflicts. Description: Learn and implement problem-solving and conflict resolution skills. Target Date: 2021-08-18 Frequency: Daily Modality: individual Progress: 60%  Related Problem: Increase awareness of own role in the relationship conflicts. Description: Acknowledge the connection between substance abuse and the conflicts present within the relationship. Target Date: 2021-03-20 Frequency: Daily Modality: individual Progress: 100%  Related Problem: New Goal Statement for Anxiety Description: Maintain involvement in work, family, and social activities. Target Date: 2021-08-18 Frequency: Daily Modality: individual Progress: 90%  Related Problem: New Goal Statement for Anxiety Description: Learn and implement problem-solving strategies for realistically  addressing worries. Target Date: 2021-08-18 Frequency: Daily Modality: individual Progress: 80%  Related Problem: New Goal Statement for Anxiety Description: Describe situations, thoughts, feelings, and actions associated with anxieties and worries, their impact on functioning, and attempts to resolve them. Target Date: 2021-08-18 Frequency: Daily Modality: individual Progress: 80%  Client Response full compliance  Service Location Location, 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 Alcohol abuse.  Meds: 10mg  Adderall.  Goals: He is seeking strategies to manage marital discord and associated anxiety. target 6-23. He is also wanting to better manage his alcohol intake and/or consider abstinence. Target 1-23. Completed  Patient agrees to have a video Webex session due to the pandemic. He is at his office and I am in my home office.  Alex: [id number] says he continues to feel very good with the exception of his wife's inconsistent behavior and expressed anger. A couple of weeks ago he took the kids skiing. On way home, his wife told him she did not feel well and that Emet should stay with him. Went to get his backpack and Emet wanted to see his mom. Emet told his mom that he hated dad even though says they had a great weekend. He had no indication from [facility] that he was upset. [name] told [facility] that she was upset that he brought Emet to her house because he did not want to leave her house and be with dad. She also continues to say hostile comments toward him in front of the children.  His attorney is meeting with [place] attorney and the judge to  talk about her refusal to sign the judge's order.  He reports that he is no longer feeling "on the verge of a panic attack". He is still dating [name] and says she is "amazing, sweet and empathetic". [name] says is is a relief to be open and honest in a relationship. He says he has been very happy with her.                    [name], PhD Time: 1:05p-2:00p 55 min

## 2021-05-08 IMAGING — CT CT HEART MORP W/ CTA COR W/ SCORE W/ CA W/CM &/OR W/O CM
4 of 7 series · 8 of 20 positions shown, 9 images · non-contrast
Comparison: None

Addendum:
CLINICAL DATA: 38-year-old male with h/o HTN, HLP, FH of premature
CAD and new chest pain.

EXAM:
Cardiac/Coronary  CTA
TECHNIQUE: The patient was scanned on a [employer] scanner.

[Series 7: best diast 76 % · axial · 0.39mm/px · z∈[-347,-302]mm · 2 of 341 slices shown, 3 images]
[im 114/341  vessel]
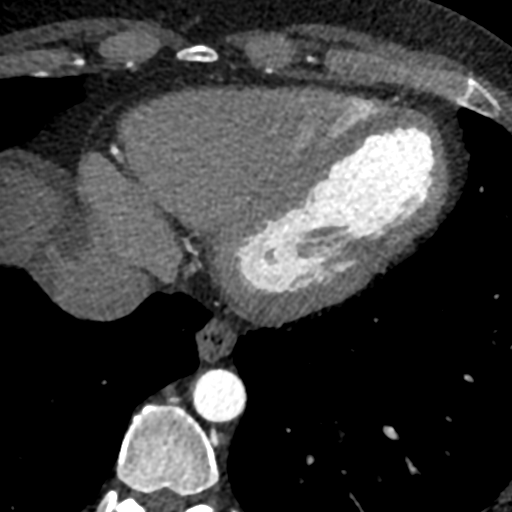
[im 114/341  lung]
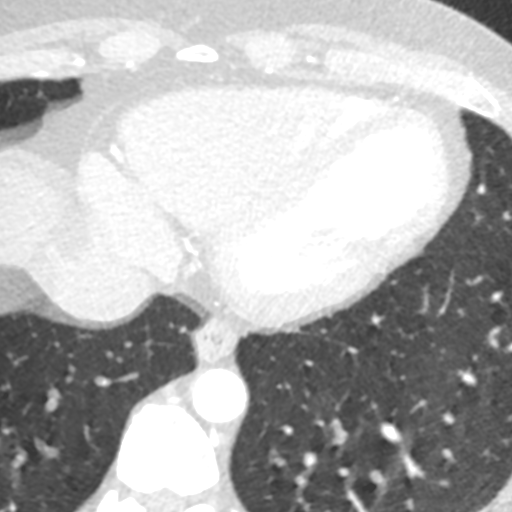
[im 227/341  vessel]
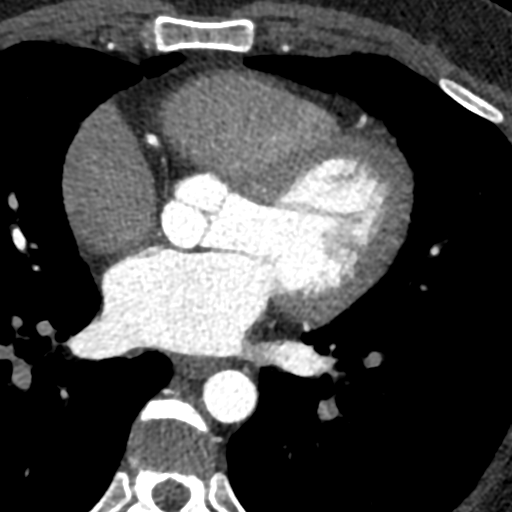

[Series 8: best syst 32 % · axial · 0.39mm/px · z∈[-347,-302]mm · 2 of 341 slices shown]
[im 114/341  vessel]
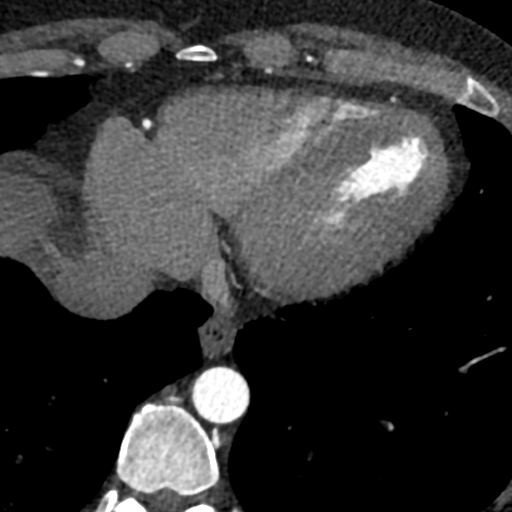
[im 227/341  vessel]
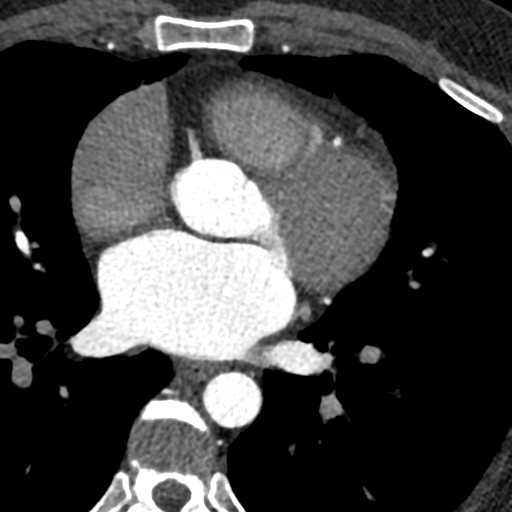

[Series 9: ts diast sharp 32 % · axial · 0.39mm/px · z∈[-347,-302]mm · 2 of 341 slices shown]
[im 114/341  lung]
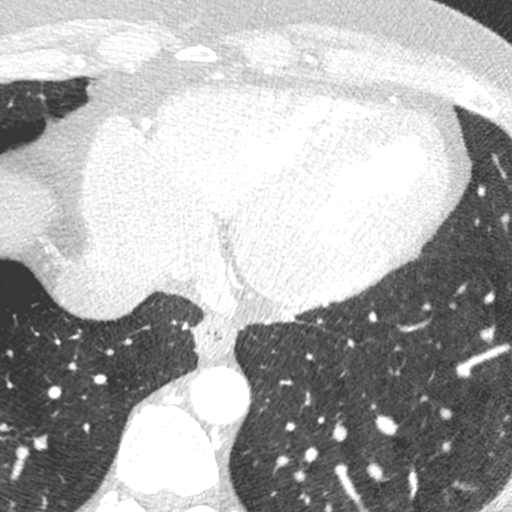
[im 227/341  lung]
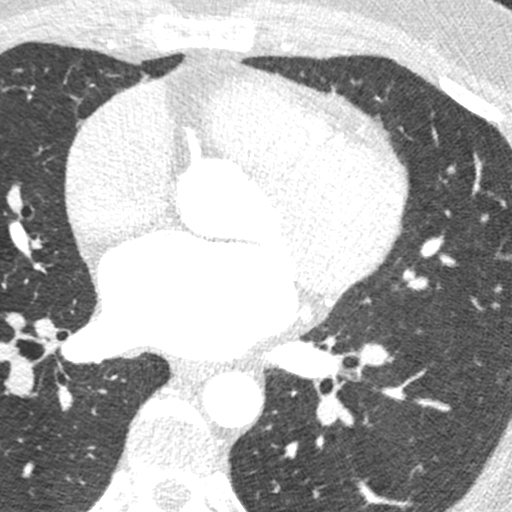

[Series 10: ts syst sharp 32 % · axial · 0.39mm/px · z∈[-347,-302]mm · 2 of 341 slices shown]
[im 114/341  lung]
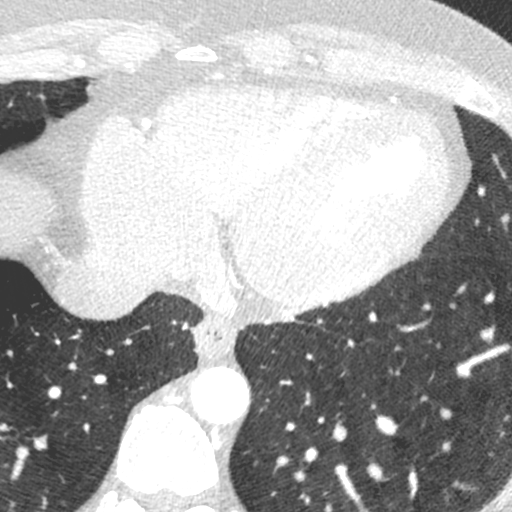
[im 227/341  lung]
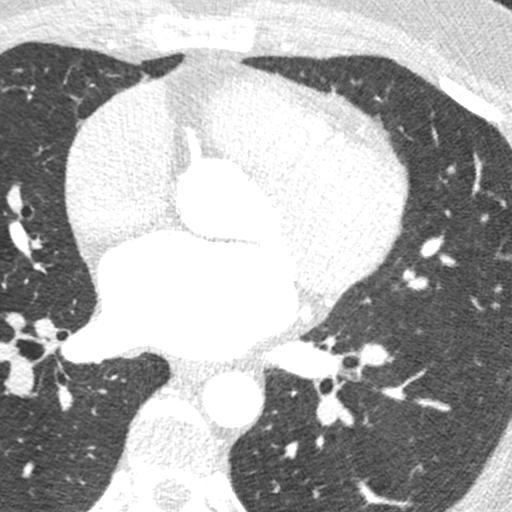

[8 of 20 positions shown; findings below may reference images not displayed]



Aorta:  Normal size.  No calcifications.  No dissection.

Aortic Valve:  Trileaflet.  No calcifications.

Coronary Arteries:  Normal coronary origin.  Right dominance.

RCA is a large dominant artery that gives rise to PDA and PLA. There
is no plaque.

Left main is a large artery that gives rise to LAD and LCX arteries.
Left main has no plaque.

LAD is a large vessel that gives rise to one diagonal artery and has
no plaque.

LCX is a non-dominant artery that gives rise to one large OM1
branch. There is no plaque.

Other findings:

Normal pulmonary vein drainage into the left atrium.

Normal left atrial appendage without a thrombus.

Normal size of the pulmonary artery.
IMPRESSION: 1. Coronary calcium score of 0. This was 0 percentile for age and
sex matched control.

2. Normal coronary origin with right dominance.

3. CAD-RADS 0. No evidence of CAD (0%). Consider non-atherosclerotic
causes of chest pain.

EXAM:
OVER-READ INTERPRETATION  CT CHEST

The following report is an over-read performed by radiologist Dr.
Peter Hernandz [[email or web address]] on 07/20/2019. This over-read
does not include interpretation of cardiac or coronary anatomy or
pathology. The coronary CTA interpretation by the cardiologist is
attached.
FINDINGS: Heart is normal size. Aorta normal caliber. No adenopathy.
Triangular nodule in the right middle lobe measures 4 mm along the
right minor fissure. No confluent opacities or effusions. Imaging
into the upper abdomen shows no acute findings. Chest wall soft
tissues are unremarkable. No acute bony abnormality.
IMPRESSION: 4 mm nodule along the right minor fissure. No follow-up needed if
patient is low-risk. Non-contrast chest CT can be considered in 12
months if patient is high-risk. This recommendation follows the
consensus statement: Guidelines for Management of Incidental
Pulmonary Nodules Detected on CT Images: From the [HOSPITAL]

*** End of Addendum ***
FINDINGS: A 100 kV prospective scan was triggered in the descending thoracic
aorta at 111 HU's. Axial non-contrast 3 mm slices were carried out
through the heart. The data set was analyzed on a dedicated work
station and scored using the Agatson method. Gantry rotation speed
was 250 msecs and collimation was .6 mm. No beta blockade and 0.8 mg
of sl NTG was given. The 3D data set was reconstructed in 5%
intervals of the 67-82 % of the R-R cycle. Diastolic phases were
analyzed on a dedicated work station using MPR, MIP and VRT modes.
The patient received 80 cc of contrast.

Aorta:  Normal size.  No calcifications.  No dissection.

Aortic Valve:  Trileaflet.  No calcifications.

Coronary Arteries:  Normal coronary origin.  Right dominance.

RCA is a large dominant artery that gives rise to PDA and PLA. There
is no plaque.

Left main is a large artery that gives rise to LAD and LCX arteries.
Left main has no plaque.

LAD is a large vessel that gives rise to one diagonal artery and has
no plaque.

LCX is a non-dominant artery that gives rise to one large OM1
branch. There is no plaque.

Other findings:

Normal pulmonary vein drainage into the left atrium.

Normal left atrial appendage without a thrombus.

Normal size of the pulmonary artery.
IMPRESSION: 1. Coronary calcium score of 0. This was 0 percentile for age and
sex matched control.

2. Normal coronary origin with right dominance.

3. CAD-RADS 0. No evidence of CAD (0%). Consider non-atherosclerotic
causes of chest pain.

## 2021-05-11 ENCOUNTER — Ambulatory Visit (INDEPENDENT_AMBULATORY_CARE_PROVIDER_SITE_OTHER): Payer: 59 | Admitting: Psychology

## 2021-05-11 DIAGNOSIS — F4322 Adjustment disorder with anxiety: Secondary | ICD-10-CM

## 2021-05-11 NOTE — Progress Notes (Signed)
? ? ? ? ? ? ? ? ? ? ? ? ? ? ? ? ? ? ? ? ? ? ? ? ? ? ? ? ? ? ?Date: 05/11/2021  ?Treatment Plan: ?Diagnosis ?F43.22 (Adjustment Disorder, With anxiety) [n/a]  ?V61.10 (Relationship distress with spouse or intimate partner) [n/a]  ?Symptoms ?Excessive and/or unrealistic worry that is difficult to control occurring more days than not for at least 6 months about a number of events or activities. (Status: maintained) -- No Description Entered  ?Frequent or continual arguing with the partner. (Status: maintained) -- No Description Entered  ?Marital separation. (Status: maintained) -- No Description Entered  ?Pending divorce. (Status: maintained) -- No Description Entered  ?Medication Status ?compliance  ?Safety ?none  ?If Suicidal or Homicidal State Action Taken: unspecified  ?Current Risk: low ?Medications ?Zoloft (Dosage: 75mg )  ?Objectives ?Related Problem: Increase awareness of own role in the relationship conflicts. ?Description: Gain insight into how past relationship experiences influence current relationship problems. ?Target Date: 2021-08-18 ?Frequency: Daily ?Modality: individual ?Progress: 60%  ?Related Problem: Increase awareness of own role in the relationship conflicts. ?Description: Learn and implement problem-solving and conflict resolution skills. ?Target Date: 2021-08-18 ?Frequency: Daily ?Modality: individual ?Progress: 60% ? ?Related Problem: Increase awareness of own role in the relationship conflicts. ?Description: Acknowledge the connection between substance abuse and the conflicts present within the relationship. ?Target Date: 2021-03-20 ?Frequency: Daily ?Modality: individual ?Progress: 100%  ?Related Problem: New Goal Statement for Anxiety ?Description: Maintain involvement in work, family, and social activities. ?Target Date: 2021-08-18 ?Frequency: Daily ?Modality: individual ?Progress: 90%  ?Related Problem: New Goal Statement for Anxiety ?Description: Learn and implement problem-solving  strategies for realistically addressing worries. ?Target Date: 2021-08-18 ?Frequency: Daily ?Modality: individual ?Progress: 80% ? ?Related Problem: New Goal Statement for Anxiety ?Description: Describe situations, thoughts, feelings, and actions associated with anxieties and worries, their impact on functioning, and attempts to resolve them. ?Target Date: 2021-08-18 ?Frequency: Daily ?Modality: individual ?Progress: 80% ? ?Client Response ?full compliance  ?Service Location ?Location, 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 Alcohol abuse.  ?Meds: 10mg  Adderall.  ?Goals: He is seeking strategies to manage marital discord and associated anxiety. target 6-23. He is also wanting to better manage his alcohol intake and/or consider abstinence. Target 1-23. Completed  ?Patient agrees to have a video Webex session due to the pandemic. He is at his office and I am in my home office.  ?Erik King: [name] says he is feeling great and that is the only negative in his life. Kids are doing well. Erik King is the one struggling the most. Erik King was in therapy session with him last week, and Erik King expressed some angry/negative feelings. Erik King is very clear that Erik King is feeding Erik King negative thoughts about him and reinforcing hostility. ?Son Erik King asked Erik King questions about the divorce. He did share "facts" with him to help him better understand some of the tensions in the relationship.  ?We talked about appropriate communication with the kids. Suggested that he talk with his lawyer if he feels Erik King is alienating the kids from him.    ?               ? ?Erik Post, PhD  Time: 1:10p-2:00p  50 min  ? ? ? ? ? ? ? ? ? ? ? ? ? ? ? ? ? ? ? ? ? ? ? ? ? ? ? ? ? ? ?

## 2021-07-10 ENCOUNTER — Ambulatory Visit (INDEPENDENT_AMBULATORY_CARE_PROVIDER_SITE_OTHER): Payer: 59 | Admitting: Psychology

## 2021-07-10 DIAGNOSIS — F4322 Adjustment disorder with anxiety: Secondary | ICD-10-CM | POA: Diagnosis not present

## 2021-07-10 NOTE — Progress Notes (Unsigned)
? ? ? ? ? ? ? ? ? ? ? ? ? ? ? ? ? ? ? ? ? ? ? ? ? ? ? ? ? ? ? ? ? ? ? ? ? ? ? ? ? ? ? ? ? 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 (Status: maintained) -- No Description Entered  ?Frequent or continual arguing with the partner. (Status: maintained) -- No Description Entered  ?Marital separation. (Status: maintained) -- No Description Entered  ?Pending divorce. (Status: maintained) -- No Description Entered  ?Medication Status ?compliance  ?Safety ?none  ?If Suicidal or Homicidal State Action Taken: unspecified  ?Current Risk: low ?Medications ?Zoloft (Dosage: 75mg )  ?Objectives ?Related Problem: Increase awareness of own role in the relationship conflicts. ?Description: Gain insight into how past relationship experiences influence current relationship problems. ?Target Date: 2021-08-18 ?Frequency: Daily ?Modality: individual ?Progress: 100%  ?Related Problem: Increase awareness of own role in the relationship conflicts. ?Description: Learn and implement problem-solving and conflict resolution skills. ?Target Date: 2022-02-17 ?Frequency: Daily ?Modality: individual ?Progress: 60% ? ?Related Problem: Increase awareness of own role in the relationship conflicts. ?Description: Acknowledge the connection between substance abuse and the conflicts present within the relationship. ?Target Date: 2021-03-20 ?Frequency: Daily ?Modality: individual ?Progress: 100%  ?Related Problem: New Goal Statement for Anxiety ?Description: Maintain involvement in work, family, and social activities. ?Target Date: 2022-02-17 ?Frequency: Daily ?Modality: individual ?Progress: 90%  ?Related Problem: New Goal Statement for Anxiety ?Description: Learn and  implement problem-solving strategies for realistically addressing worries. ?Target Date: 2022-02-17 ?Frequency: Daily ?Modality: individual ?Progress: 80% ? ?Related Problem: New Goal Statement for Anxiety ?Description: Describe situations, thoughts, feelings, and actions associated with anxieties and worries, their impact on functioning, and attempts to resolve them. ?Target Date: 2022-02-17 ?Frequency: Daily ?Modality: individual ?Progress: 80% ? ?Client Response ?full compliance  ?Service Location ?Location, 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 Alcohol abuse.  ?Meds: 10mg  Adderall.  ?Goals: He is seeking strategies to manage marital discord and associated anxiety. target 12-23. He is also wanting to better manage his alcohol intake and/or consider abstinence. Target 1-23. Completed  ?Patient agrees to have a video Webex session due to the pandemic. He is at his office and I am in my home office.  ?Erik King: States that life is good in all ways except Erik King continues to be difficult. She makes things problematic at every step of the way. Erik King is still having problems emotionally, but he feels that his son is doing better than before. He believes that is alienating Erik King from him and his family, but Erik King does "rebound" quickly. Erik King says that there is a court proceeding tomorrow, but he fears that Erik King will not cooperate with the equitable distribution. Spousal and child support still unsettled. They will meeting with the mediator. He is eligible to file for divorce next week and wants to get things settled so he can "move on". He is trying to keep  everything consistent for  the kids but the financial strains are making it more difficult as time goes on.  ?He got a call from clergy at church telling him that Erik King had called and made many accusations about Erik King and his lack of support to the family. She made claims to the clergy that she is on food stamps and that Erik King "drank too much in the marriage". Erik King is hoping tomorrow will be a step forward toward the divorce.  ?Erik King is still seeing Erik King and he says "it is fantastic". He has not shared with the kids that he is seeing Erik King, but Erik King implied to their son that Erik King is seeing someone. Erik King's boyfriend is very involved in the kid's lives, and stays at her house even when the kid's are there with her.  ?He continues to take good care of himself, working out 5-6 days/week. He does say that, "other than dealing with Erik King", he is in a "much better place".       ?               ? ?Erik Ridgel, PhD Time: 11:40p-12:30p 50 min  ? ? ? ? ? ? ? ? ? ? ? ? ? ? ? ? ? ? ? ? ? ? ? ? ? ? ? ? ? ? ?

## 2021-07-10 NOTE — Progress Notes (Incomplete)
? ? ? ? ? ? ? ? ? ? ? ? ? ? ? ? ? ? ? ? ? ? ? ? ? ? ? ? ? ? ? ? ? ? ? ? ? ? ? ? ? ? ? ? ? 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 (Status: maintained) -- No Description Entered  ?Frequent or continual arguing with the partner. (Status: maintained) -- No Description Entered  ?Marital separation. (Status: maintained) -- No Description Entered  ?Pending divorce. (Status: maintained) -- No Description Entered  ?Medication Status ?compliance  ?Safety ?none  ?If Suicidal or Homicidal State Action Taken: unspecified  ?Current Risk: low ?Medications ?Zoloft (Dosage: 75mg )  ?Objectives ?Related Problem: Increase awareness of own role in the relationship conflicts. ?Description: Gain insight into how past relationship experiences influence current relationship problems. ?Target Date: 2021-08-18 ?Frequency: Daily ?Modality: individual ?Progress: 60%  ?Related Problem: Increase awareness of own role in the relationship conflicts. ?Description: Learn and implement problem-solving and conflict resolution skills. ?Target Date: 2021-08-18 ?Frequency: Daily ?Modality: individual ?Progress: 60% ? ?Related Problem: Increase awareness of own role in the relationship conflicts. ?Description: Acknowledge the connection between substance abuse and the conflicts present within the relationship. ?Target Date: 2021-03-20 ?Frequency: Daily ?Modality: individual ?Progress: 100%  ?Related Problem: New Goal Statement for Anxiety ?Description: Maintain involvement in work, family, and social activities. ?Target Date: 2021-08-18 ?Frequency: Daily ?Modality: individual ?Progress: 90%  ?Related Problem: New Goal Statement for Anxiety ?Description: Learn and  implement problem-solving strategies for realistically addressing worries. ?Target Date: 2021-08-18 ?Frequency: Daily ?Modality: individual ?Progress: 80% ? ?Related Problem: New Goal Statement for Anxiety ?Description: Describe situations, thoughts, feelings, and actions associated with anxieties and worries, their impact on functioning, and attempts to resolve them. ?Target Date: 2021-08-18 ?Frequency: Daily ?Modality: individual ?Progress: 80% ? ?Client Response ?full compliance  ?Service Location ?Location, 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 Alcohol abuse.  ?Meds: 10mg  Adderall.  ?Goals: He is seeking strategies to manage marital discord and associated anxiety. target 6-23. He is also wanting to better manage his alcohol intake and/or consider abstinence. Target 1-23. Completed  ?Patient agrees to have a video Webex session due to the pandemic. He is at his office and I am in my home office.  ?Alex: [name] says he is feeling great and that Ebony Hail is the only negative in his life. Kids are doing well. Emet is the one struggling the most. [name] was in therapy session with him last week, and Emet expressed some angry/negative feelings. [name] is very clear that Ebony Hail is feeding Emet negative thoughts about him and reinforcing hostility. ?Son Luisa Hart asked [name] questions about the divorce. He did share "facts" with him to help him better understand some of the tensions in the relationship.  ?We talked about appropriate communication with the kids. Suggested that he talk with his lawyer if he feels Ebony Hail is alienating the kids from him.    ?                ? ?  Marcelina Morel, PhD Time: 11:40p-2:00p 50 min  ? ? ? ? ? ? ? ? ? ? ? ? ? ? ? ? ? ? ? ? ? ? ? ? ? ? ? ? ? ? ?

## 2021-07-27 ENCOUNTER — Ambulatory Visit (INDEPENDENT_AMBULATORY_CARE_PROVIDER_SITE_OTHER): Payer: 59 | Admitting: Psychology

## 2021-07-27 DIAGNOSIS — F4322 Adjustment disorder with anxiety: Secondary | ICD-10-CM | POA: Diagnosis not present

## 2021-07-27 NOTE — Progress Notes (Signed)
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 (Status: maintained) -- No Description Entered  Frequent or continual arguing with the partner. (Status: maintained) -- No Description Entered  Marital separation. (Status: maintained) -- No Description Entered  Pending divorce. (Status: maintained) -- No Description Entered  Medication Status compliance  Safety none  If Suicidal or Homicidal State Action Taken: unspecified  Current Risk: low Medications Zoloft (Dosage: 75mg )  Objectives Related Problem: Increase awareness of own role in the relationship conflicts. Description: Gain insight into how past relationship experiences influence current relationship problems. Target Date: 2021-08-18 Frequency: Daily Modality: individual Progress: 60%  Related Problem: Increase awareness of own role in the relationship conflicts. Description: Learn and implement problem-solving and conflict resolution skills. Target Date: 2021-08-18 Frequency: Daily Modality: individual Progress: 60%  Related Problem: Increase awareness of own role in the relationship conflicts. Description: Acknowledge the connection between substance abuse and the conflicts present within the relationship. Target Date: 2021-03-20 Frequency: Daily Modality: individual Progress: 100%  Related Problem: New Goal Statement for Anxiety Description: Maintain involvement in work, family, and social activities. Target Date: 2021-08-18 Frequency: Daily Modality: individual Progress: 90%  Related Problem: New Goal Statement for Anxiety Description: Learn and  implement problem-solving strategies for realistically addressing worries. Target Date: 2021-08-18 Frequency: Daily Modality: individual Progress: 80%  Related Problem: New Goal Statement for Anxiety Description: Describe situations, thoughts, feelings, and actions associated with anxieties and worries, their impact on functioning, and attempts to resolve them. Target Date: 2021-08-18 Frequency: Daily Modality: individual Progress: 80%  Client Response full compliance  Service Location Location, 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 Alcohol abuse.  Meds: 10mg  Adderall.  Goals: He is seeking strategies to manage marital discord and associated anxiety. target 6-23. He is also wanting to better manage his alcohol intake and/or consider abstinence. Target 1-23. Completed  Patient agrees to have a video Webex session due to the pandemic. He is at his office and I am in my home office.  Alex: [name] says mediation lasted from 9am-7pm. He feels he made a reasonable "deal" with regard to equitable distribution. Have not settled on child support details, but they are getting close. They are still negotiating the alimony question. He is feeling positive about the mediation, but says his wife is being irrational and is continuing to cause problems. He says it has been helpful to tell himself "do not take the bait" (as we had discussed). He states that Ebony Hail is the "one stressor" in his life. Everything else in his life is "great". Work is going well as is his romantic relationship. They have been dating for almost 5 months. He says that they have  paced the relationship and  it is working out well for both of them.                     Marcelina Morel, PhD Time: 10:40a-11:30p 50 min

## 2021-08-24 ENCOUNTER — Ambulatory Visit: Payer: 59 | Admitting: Psychology

## 2021-09-19 ENCOUNTER — Ambulatory Visit (INDEPENDENT_AMBULATORY_CARE_PROVIDER_SITE_OTHER): Payer: 59 | Admitting: Psychology

## 2021-09-19 DIAGNOSIS — F4322 Adjustment disorder with anxiety: Secondary | ICD-10-CM | POA: Diagnosis not present

## 2021-09-19 NOTE — Progress Notes (Addendum)
Date: 09/19/2021  Treatment Plan: Diagnosis F43.22 (Adjustment Disorder, With anxiety) [n/a]  V61.10 (Relationship distress with spouse or intimate partner) [n/a]  Symptoms Excessive and/or unrealistic worry that is difficult to control occurring more days than not for at least 6 months about a number of events or activities. (Status: maintained) -- No Description Entered  Frequent or continual arguing with the partner. (Status: maintained) -- No Description Entered  Marital separation. (Status: maintained) -- No Description Entered  Pending divorce. (Status: maintained) -- No Description Entered  Medication Status compliance  Safety none  If Suicidal or Homicidal State Action Taken: unspecified  Current Risk: low Medications Zoloft (Dosage: 75mg )  Objectives Related Problem: Increase awareness of own role in the relationship conflicts. Description: Gain insight into how past relationship experiences influence current relationship problems. Target Date: 2021-08-18 Frequency: Daily Modality: individual Progress: 100% complete Related Problem: Increase awareness of own role in the relationship conflicts. Description: Learn and implement problem-solving and conflict resolution skills. Target Date: 2022-02-17 Frequency: Daily Modality: individual Progress: 80%  Related Problem: Increase awareness of own role in the relationship conflicts. Description: Acknowledge the connection between substance abuse and the conflicts present within the relationship. Target Date: 2021-03-20 Frequency: Daily Modality: individual Progress: 100%  Related Problem: New Goal Statement for Anxiety Description: Maintain involvement in work, family, and social activities. Target Date: 2022-02-17 Frequency: Daily Modality: individual Progress: 90%  Related Problem: New Goal Statement for Anxiety Description: Learn and implement problem-solving strategies for  realistically addressing worries. Target Date: 2022-02-17 Frequency: Daily Modality: individual Progress: 80%  Related Problem: New Goal Statement for Anxiety Description: Describe situations, thoughts, feelings, and actions associated with anxieties and worries, their impact on functioning, and attempts to resolve them. Target Date: 2022-02-17 Frequency: Daily Modality: individual Progress: 80%  Client Response full compliance  Service Location Location, 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 Alcohol abuse.  Meds: 10mg  Adderall.  Goals: He is seeking strategies to manage marital discord and associated anxiety. target 12-23. He is also wanting to better manage his alcohol intake and/or consider abstinence. Target 1-23. Completed  Patient agrees to have a video Webex session. He is at his office and I am in my home office.  Alex: [id number] says "life is great" with the exception of his ex-wife, who is "out of control". He says has "burned every bridge" and people are not talking with her. He thought that they were close to a settlement at court, but she is fighting him and everything is delayed. The problem is agreeing on a settlement financially. He feels that [name] continues to "berate" him. He heard from one of the baseball coaches that she called police on her boyfriend on mother's day for assault. He also heard that she called DSS on her boyfriend and he lost custody of his son. Alex says that [name] is still hanging out with her boyfriend. [name] is blocking son from playing baseball because team took her off of the group  e-mail due to her behavior. Alex feels that [name] is not taking care of herself physically and fears she  will have a terrible outcome. He also sometimes is concerned about her being self-destructive as others are starting to avoid her.  He reports that he has not has alcohol on any days the kids are with him for 14 months. He will sometimes drink moderately on days when he does not have custody of the kids. Does not over-indulge. We discussed how to talk to the kids to introduce them to his girlfriend, which he hopes to do soon.                        [name], PhD Time: 11:40a-12:30p 50 min

## 2021-12-20 ENCOUNTER — Ambulatory Visit (INDEPENDENT_AMBULATORY_CARE_PROVIDER_SITE_OTHER): Payer: 59 | Admitting: Psychology

## 2021-12-20 DIAGNOSIS — F4322 Adjustment disorder with anxiety: Secondary | ICD-10-CM | POA: Diagnosis not present

## 2021-12-20 NOTE — Progress Notes (Signed)
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 (Status: maintained) -- No Description Entered  Frequent or continual arguing with the partner. (Status: maintained) -- No Description Entered  Marital separation. (Status: maintained) -- No Description Entered  Pending divorce. (Status: maintained) -- No Description Entered  Medication Status compliance  Safety none  If Suicidal or Homicidal State Action Taken: unspecified  Current Risk: low Medications Zoloft (Dosage: 75mg )  Objectives Related Problem: Increase awareness of own role in the relationship conflicts. Description: Gain insight into how past relationship experiences influence current relationship problems. Target Date: 2021-08-18 Frequency: Daily Modality: individual Progress: 100% complete Related Problem: Increase awareness of own role in the relationship conflicts. Description: Learn and implement problem-solving and conflict resolution skills. Target Date: 2022-02-17 Frequency: Daily Modality: individual Progress: 80%  Related Problem: Increase awareness of own role in the relationship conflicts. Description: Acknowledge the connection between substance abuse and the conflicts present within the relationship. Target Date: 2021-03-20 Frequency: Daily Modality: individual Progress: 100%  Related Problem: New Goal Statement for Anxiety Description: Maintain involvement in work, family, and social activities. Target Date: 2022-02-17 Frequency: Daily Modality: individual Progress: 90%  Related Problem: New Goal Statement for Anxiety Description: Learn and implement  problem-solving strategies for realistically addressing worries. Target Date: 2022-02-17 Frequency: Daily Modality: individual Progress: 80%  Related Problem: New Goal Statement for Anxiety Description: Describe situations, thoughts, feelings, and actions associated with anxieties and worries, their impact on functioning, and attempts to resolve them. Target Date: 2022-02-17 Frequency: Daily Modality: individual Progress: 80%  Client Response full compliance  Service Location Location, 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 Alcohol abuse.  Meds: 10mg  Adderall.  Goals: He is seeking strategies to manage marital discord and associated anxiety. target 12-23. He is also wanting to better manage his alcohol intake and/or consider abstinence. Target 1-23. Completed  Patient agrees to have a video Webex session. He is at his office and I am in my home office.  Erik King: Erik King says divorce is official as of Sept. 11th. In addition, got judge's ruling on child/spousal support. He is mostly pleased. He does say when his ex-wife tries to speak with him, he gets nervous and has trouble articulating himself. He has been asking for co-parenting counseling, and they have had 1 session. Erik King then cancelled 2nd session minutes before the session time. They are scheduled for another this week, but Erik King again says she will not participate. She challenges his parenting and accuses him of being a terrible father. Suggested that all communication with Erik King take place with e-mail and text. Otherwise, their communication ends up in an argument. Erik King feels she  is getting worse, but hopes it will one day improve. He thinks his kids are doing great and says that  Erik King (the one she identifies as having a terrible relationship with him) is in a good place and they have a good relationship.  He still has not had any alcohol around his kids for 1 yr. And 5 months. He states he is loving his life right now, with the exception of Erik King.                         [name], PhD Time: 4:05p-5:00p 55 min

## 2022-01-10 ENCOUNTER — Ambulatory Visit (INDEPENDENT_AMBULATORY_CARE_PROVIDER_SITE_OTHER): Payer: 59 | Admitting: Psychology

## 2022-01-10 DIAGNOSIS — F4322 Adjustment disorder with anxiety: Secondary | ICD-10-CM | POA: Diagnosis not present

## 2022-01-10 NOTE — Progress Notes (Signed)
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 (Status: maintained) -- No Description Entered  Frequent or continual arguing with the partner. (Status: maintained) -- No Description Entered  Marital separation. (Status: maintained) -- No Description Entered  Pending divorce. (Status: maintained) -- No Description Entered  Medication Status compliance  Safety none  If Suicidal or Homicidal State Action Taken: unspecified  Current Risk: low Medications Zoloft (Dosage: 75mg )  Objectives Related Problem: Increase awareness of own role in the relationship conflicts. Description: Gain insight into how past relationship experiences influence current relationship problems. Target Date: 2021-08-18 Frequency: Daily Modality: individual Progress: 100% complete Related Problem: Increase awareness of own role in the relationship conflicts. Description: Learn and implement problem-solving and conflict resolution skills. Target Date: 2022-02-17 Frequency: Daily Modality: individual Progress: 80%  Related Problem: Increase awareness of own role in the relationship conflicts. Description: Acknowledge the connection between substance abuse and the conflicts present within the relationship. Target Date: 2021-03-20 Frequency: Daily Modality: individual Progress: 100%  Related Problem: New Goal Statement for Anxiety Description: Maintain involvement in work, family, and social activities. Target Date: 2022-02-17 Frequency: Daily Modality: individual Progress: 90%  Related Problem: New Goal Statement for  Anxiety Description: Learn and implement problem-solving strategies for realistically addressing worries. Target Date: 2022-02-17 Frequency: Daily Modality: individual Progress: 80%  Related Problem: New Goal Statement for Anxiety Description: Describe situations, thoughts, feelings, and actions associated with anxieties and worries, their impact on functioning, and attempts to resolve them. Target Date: 2022-02-17 Frequency: Daily Modality: individual Progress: 80%  Client Response full compliance  Service Location Location, 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 Alcohol abuse.  Meds: 10mg  Adderall.  Goals: He is seeking strategies to manage marital discord and associated anxiety. target 12-23. He is also wanting to better manage his alcohol intake and/or consider abstinence. Target 1-23. Completed  Patient agrees to have a video Webex session. He is at his office and I am in my home office.  Erik King: [id number] says that his ex-wife is not willing to participate in co-parenting counseling. He states that he has shifted from being very distressed by his ex-wife's threats, to being much less reactive. He thinks that the clarity of the financial arrangements has helped. He also no longer fears that he may "lose" his children. He reports that Alison's behavior is often out of control, and gave an example of her challenging their son's ) baseball coach. She has a new boyfriend and Erik King is hopeful that will help her be more cooperative. On weeks that he does not have his kids, he goes to church will girlfriend to her church. Every time he  goes, he reports that he  gets very emotional. It is [facility] and he had been practicing Catholicism. He and his girlfriend Erik King are sharing in their spiritual beliefs. We talked about him having a connection with a partner in a way he has never felt. He is about to "celebrate" no alcohol with the kids for 18 months.                             Erik Morel, PhD Time: 9:40a-10:30a 50 min

## 2022-01-11 ENCOUNTER — Ambulatory Visit: Payer: 59 | Admitting: Psychology

## 2022-01-31 ENCOUNTER — Ambulatory Visit (INDEPENDENT_AMBULATORY_CARE_PROVIDER_SITE_OTHER): Payer: 59 | Admitting: Psychology

## 2022-01-31 DIAGNOSIS — F4322 Adjustment disorder with anxiety: Secondary | ICD-10-CM

## 2022-01-31 NOTE — Progress Notes (Addendum)
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 (Status: maintained) -- No Description Entered  Frequent or continual arguing with the partner. (Status: maintained) -- No Description Entered  Marital separation. (Status: maintained) -- No Description Entered  Pending divorce. (Status: maintained) -- No Description Entered  Medication Status compliance  Safety none  If Suicidal or Homicidal State Action Taken: unspecified  Current Risk: low Medications Zoloft (Dosage: 75mg )  Objectives Related Problem: Increase awareness of own role in the relationship conflicts. Description: Gain insight into how past relationship experiences influence current relationship problems. Target Date: 2021-08-18 Frequency: Daily Modality: individual Progress: 100% complete Related Problem: Increase awareness of own role in the relationship conflicts. Description: Learn and implement problem-solving and conflict resolution skills. Target Date: 2022-02-17 Frequency: Daily Modality: individual Progress: 80%  Related Problem: Increase awareness of own role in the relationship conflicts. Description: Acknowledge the connection between substance abuse and the conflicts present within the relationship. Target Date: 2021-03-20 Frequency: Daily Modality: individual Progress: 100%  Related Problem: New Goal Statement for Anxiety Description: Maintain involvement in work, family, and social activities. Target Date: 2022-02-17 Frequency: Daily Modality: individual Progress: 90%  Related Problem: New Goal Statement for Anxiety Description: Learn and implement problem-solving strategies for realistically addressing  worries. Target Date: 2022-02-17 Frequency: Daily Modality: individual Progress: 80%  Related Problem: New Goal Statement for Anxiety Description: Describe situations, thoughts, feelings, and actions associated with anxieties and worries, their impact on functioning, and attempts to resolve them. Target Date: 2022-02-17 Frequency: Daily Modality: individual Progress: 80%  Client Response full compliance  Service Location Location, 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 Alcohol abuse.  Meds: 10mg  Adderall.  Goals: He is seeking strategies to manage marital discord and associated anxiety. target 12-23. He is also wanting to better manage his alcohol intake and/or consider abstinence. Target 1-23. Completed  Patient agrees to have a video Webex session. He is at his office and I am in my home office.  Erik King: [id number] says that he completed a half marathon and is proud of his accomplishment. Work is going great and he is really gratified by his relationship with . Everything is going well for him except the anger and bitterness of his ex-wife. She keeps "putting the kids in the middle" of the two of them.He sdays "some days I'm fine and she does not bother me and other times it is very upsetting".  He is concerned that Erik King is causing stress in his relationship with Erik King. Erik King is upset that Erik King takes up so much of their conversation with each other. He had a difficult exchange with son Erik King that revealed his anger at [facility] for the divorce. They had a good talk and he feels they are in a good place. Erik King perpetuates the narrative that  all the kids are angry with him for the divorce and how he treats her. Erik King feels like he has taken a step backwards with regard to his  capacity to let Erik King to get under his skin. We talked about strategies and the need to rely on friends for feedback and not always on Erik King.                                Erik Ridgel, PhD Time: 11:40a-12:30p 50 min

## 2022-02-21 ENCOUNTER — Ambulatory Visit: Payer: 59 | Admitting: Psychology

## 2022-03-09 ENCOUNTER — Ambulatory Visit (INDEPENDENT_AMBULATORY_CARE_PROVIDER_SITE_OTHER): Payer: 59 | Admitting: Psychology

## 2022-03-09 DIAGNOSIS — F4322 Adjustment disorder with anxiety: Secondary | ICD-10-CM | POA: Diagnosis not present

## 2022-03-09 NOTE — Progress Notes (Signed)
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 (Status: maintained) -- No Description Entered  Frequent or continual arguing with the partner. (Status: maintained) -- No Description Entered  Marital separation. (Status: maintained) -- No Description Entered  Pending divorce. (Status: maintained) -- No Description Entered  Medication Status compliance  Safety none  If Suicidal or Homicidal State Action Taken: unspecified  Current Risk: low Medications Zoloft (Dosage: 75mg )  Objectives Related Problem: Increase awareness of own role in the relationship conflicts. Description: Gain insight into how past relationship experiences influence current relationship problems. Target Date: 2021-08-18 Frequency: Daily Modality: individual Progress: 100% complete Related Problem: Increase awareness of own role in the relationship conflicts. Description: Learn and implement problem-solving and conflict resolution skills. Target Date: 2022-08-19 Frequency: Daily Modality: individual Progress: 80%  Related Problem: Increase awareness of own role in the relationship conflicts. Description: Acknowledge the connection between substance abuse and the conflicts present within the relationship. Target Date: 2021-03-20 Frequency: Daily Modality: individual Progress: 100%  Related Problem: New Goal Statement for Anxiety Description: Maintain involvement in work, family, and social activities. Target Date: 2022-08-19 Frequency: Daily Modality: individual Progress: 90%  Related Problem: New Goal Statement for Anxiety Description: Learn and implement problem-solving strategies for  realistically addressing worries. Target Date: 2022-08-19 Frequency: Daily Modality: individual Progress: 80%  Related Problem: New Goal Statement for Anxiety Description: Describe situations, thoughts, feelings, and actions associated with anxieties and worries, their impact on functioning, and attempts to resolve them. Target Date: 2022-08-19 Frequency: Daily Modality: individual Progress: 80%  Client Response full compliance  Service Location Location, 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 Alcohol abuse.  Meds: 10mg  Adderall.  Goals: He is seeking strategies to manage marital discord and associated anxiety. target 6-24. He is also wanting to better manage his alcohol intake and/or consider abstinence. Target 1-23. Completed  Patient agrees to have a video Webex session. He is at his office and I am in my home office.  Erik King: He says "holiday was great" and he had the kids for half of the holiday. Went to his parent's place with the kids prior to Christmas. They then had a Christmas gathering at Erik King family and it went well. He says the kids went to their mother's house on the afternoon on Christmas day. He says that Erik King has gotten to the point that she will not text and will only send e-mails. His ex-wife has another new boyfriend (her third in a couple of months). She still "gets under his skin" and he needs to learn to disconnect from her emotionally. Erik King refuses to go to co-parenting counseling, telling him that he needs to work on himself. We discussed ways to respond to [facility] when she tries to speak with him face to face.  He is very frustrated about the financial arrangement with his ex-wife. Feels it is unfair that she  wants 15-20 years of spouse support.  He states that he is attempting to make [name] less of a topic in his discussions with [name]. He checks in with her to make sure he is not too focused on [place]. He has improved in this regard.                                   [name], PhD Time: 8:40a-9:30a 50 min

## 2022-05-03 ENCOUNTER — Ambulatory Visit (INDEPENDENT_AMBULATORY_CARE_PROVIDER_SITE_OTHER): Payer: 59 | Admitting: Psychology

## 2022-05-03 DIAGNOSIS — F4322 Adjustment disorder with anxiety: Secondary | ICD-10-CM

## 2022-05-03 NOTE — Progress Notes (Signed)
m  Date: 05/03/2022  Treatment Plan: Diagnosis F43.22 (Adjustment Disorder, With anxiety) [n/a]  V61.10 (Relationship distress with spouse or intimate partner) [n/a]  Symptoms Excessive and/or unrealistic worry that is difficult to control occurring more days than not for at least 6 months about a number of events or activities. (Status: maintained) -- No Description Entered  Frequent or continual arguing with the partner. (Status: maintained) -- No Description Entered  Marital separation. (Status: maintained) -- No Description Entered  Pending divorce. (Status: maintained) -- No Description Entered  Medication Status compliance  Safety none  If Suicidal or Homicidal State Action Taken: unspecified  Current Risk: low Medications Zoloft (Dosage: 52m)  Objectives Related Problem: Increase awareness of own role in the relationship conflicts. Description: Gain insight into how past relationship experiences influence current relationship problems. Target Date: 2021-08-18 Frequency: Daily Modality: individual Progress: 100% complete Related Problem: Increase awareness of own role in the relationship conflicts. Description: Learn and implement problem-solving and conflict resolution skills. Target Date: 2022-08-19 Frequency: Daily Modality: individual Progress: 80%  Related Problem: Increase awareness of own role in the relationship conflicts. Description: Acknowledge the connection between substance abuse and the conflicts present within the relationship. Target Date: 2021-03-20 Frequency: Daily Modality: individual Progress: 100%  Related Problem: New Goal Statement for Anxiety Description: Maintain involvement in work, family, and social activities. Target Date: 2022-08-19 Frequency: Daily Modality: individual Progress: 90%  Related Problem: New Goal Statement for Anxiety Description: Learn and implement  problem-solving strategies for realistically addressing worries. Target Date: 2022-08-19 Frequency: Daily Modality: individual Progress: 80%  Related Problem: New Goal Statement for Anxiety Description: Describe situations, thoughts, feelings, and actions associated with anxieties and worries, their impact on functioning, and attempts to resolve them. Target Date: 2022-08-19 Frequency: Daily Modality: individual Progress: 80%  Client Response full compliance  Service Location Location, 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 Alcohol abuse.  Meds: 164mAdderall.  Goals: He is seeking strategies to manage marital discord and associated anxiety. target 6-24. He is also wanting to better manage his alcohol intake and/or consider abstinence. Target 1-23. Completed  Patient agrees to have a video Webex session. He is at his office and I am in my home office.  Erik King: We talked about his biggest challenge, which is tolerating his ex-wife and her hostile and negative behaviors. He reports that he has gone to his girlfriend's church a few times. On his last visit, he heard a sermon that was very significant and meaningful for him. He had been feeling very guilty for having 2 divorces. The sermon helped absolve him of the blame he had been feeling. He thought that he had worked through these divorces but realized he had been carrying "so much weight" from these divorces. He now feels a significant weight off of him. It has also changed his current relationship with Erik King he has been seeing in 44 months. He feels better equipped to keep Erik King of  his current relationship. He also is under impression that the temporary custody order  will become permanent. Erik King is suing to have him pay her legal expenses. She is also trying to get 15 years of alimony. He states that his 34 year old daughter is playing soccer. She is "on the heavy side" and Erik King set her up for the [employer]. They had a first phone appointment. Erik King does not agree with this approach and feels this is a drastic measure at this age.  He says that his twins enjoy hanging out with Erik King. They went to have dinner with Erik King at her house and were there for 50 minutes. On way home, his daughter asked Erik King where they would live if he and Erik King got married. He asked the kids how they would feel if he and Erik King got married. They all said they would be okay with it and they had a good conversation. The next night, he picked Erik King up from practice and she asked Erik King again about when he would marry Erik King. She wanted to know specifics about when a marriage would happen. He and Erik King have talked about marriage and he has ordered a ring. Kids are not aware. His 44 year old was not enthusiastic. But also not negative. He was more thoughtful and cautious. Talked with him about how to best react with the kids.                                      Erik Morel, PhD Time: 11:40a-12:30p 50 min

## 2022-05-17 ENCOUNTER — Ambulatory Visit (INDEPENDENT_AMBULATORY_CARE_PROVIDER_SITE_OTHER): Payer: 59 | Admitting: Psychology

## 2022-05-17 DIAGNOSIS — F4322 Adjustment disorder with anxiety: Secondary | ICD-10-CM | POA: Diagnosis not present

## 2022-05-17 NOTE — Progress Notes (Signed)
m  Date: 05/17/2022  Treatment Plan: Diagnosis F43.22 (Adjustment Disorder, With anxiety) [n/a]  V61.10 (Relationship distress with spouse or intimate partner) [n/a]  Symptoms Excessive and/or unrealistic worry that is difficult to control occurring more days than not for at least 6 months about a number of events or activities. (Status: maintained) -- No Description Entered  Frequent or continual arguing with the partner. (Status: maintained) -- No Description Entered  Marital separation. (Status: maintained) -- No Description Entered  Pending divorce. (Status: maintained) -- No Description Entered  Medication Status compliance  Safety none  If Suicidal or Homicidal State Action Taken: unspecified  Current Risk: low Medications Zoloft (Dosage: '75mg'$ )  Objectives Related Problem: Increase awareness of own role in the relationship conflicts. Description: Gain insight into how past relationship experiences influence current relationship problems. Target Date: 2021-08-18 Frequency: Daily Modality: individual Progress: 100% complete Related Problem: Increase awareness of own role in the relationship conflicts. Description: Learn and implement problem-solving and conflict resolution skills. Target Date: 2022-08-19 Frequency: Daily Modality: individual Progress: 80%  Related Problem: Increase awareness of own role in the relationship conflicts. Description: Acknowledge the connection between substance abuse and the conflicts present within the relationship. Target Date: 2021-03-20 Frequency: Daily Modality: individual Progress: 100%  Related Problem: New Goal Statement for Anxiety Description: Maintain involvement in work, family, and social activities. Target Date: 2022-08-19 Frequency: Daily Modality: individual Progress: 90%  Related Problem: New Goal Statement for Anxiety Description:  Learn and implement problem-solving strategies for realistically addressing worries. Target Date: 2022-08-19 Frequency: Daily Modality: individual Progress: 80%  Related Problem: New Goal Statement for Anxiety Description: Describe situations, thoughts, feelings, and actions associated with anxieties and worries, their impact on functioning, and attempts to resolve them. Target Date: 2022-08-19 Frequency: Daily Modality: individual Progress: 80%  Client Response full compliance  Service Location Location, 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 Alcohol abuse.  Meds: '10mg'$  Adderall.  Goals: He is seeking strategies to manage marital discord and associated anxiety. target 6-24. He is also wanting to better manage his alcohol intake and/or consider abstinence. Target 1-23. Completed  Patient agrees to have a video Webex session. He is at his office and I am in my home office.  Erik King: He continues to feel very positive about life and his family circumstances. Reports he is getting better about not letting his ex-wife get "under his skin". He gets most distressed when she puts the kids in between them.  They have settled equitable distribution and he is waiting for his ex-wife to sign the document. He is preparing to have her come pick up her personal items and is feeling relieved. He anticipates getting engaged this coming weekend. He continues to do a great job with self care. Works out 5-6 times/week. It has also been 22 months since he has had a drink around his children. Will drink with Sharee Pimple, but is manageable. He says he can tell a difference  (in  a positive way) and his kids have noticed as well.                                        Marcelina Morel, PhD Time: 11:40a-12:30p 50 min

## 2022-06-07 ENCOUNTER — Ambulatory Visit: Payer: 59 | Admitting: Psychology

## 2022-06-28 ENCOUNTER — Ambulatory Visit (INDEPENDENT_AMBULATORY_CARE_PROVIDER_SITE_OTHER): Payer: 59 | Admitting: Psychology

## 2022-06-28 DIAGNOSIS — F4322 Adjustment disorder with anxiety: Secondary | ICD-10-CM
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
## 2022-07-26 ENCOUNTER — Ambulatory Visit: Payer: 59 | Admitting: Psychology

## 2022-08-23 ENCOUNTER — Ambulatory Visit (INDEPENDENT_AMBULATORY_CARE_PROVIDER_SITE_OTHER): Payer: 59 | Admitting: Psychology

## 2022-08-23 DIAGNOSIS — F4322 Adjustment disorder with anxiety: Secondary | ICD-10-CM
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
## 2022-09-17 ENCOUNTER — Ambulatory Visit (INDEPENDENT_AMBULATORY_CARE_PROVIDER_SITE_OTHER): Payer: 59 | Admitting: Psychology

## 2022-09-17 DIAGNOSIS — F4322 Adjustment disorder with anxiety: Secondary | ICD-10-CM | POA: Diagnosis not present
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
## 2022-11-19 ENCOUNTER — Ambulatory Visit (INDEPENDENT_AMBULATORY_CARE_PROVIDER_SITE_OTHER): Payer: 59 | Admitting: Psychology

## 2022-11-19 DIAGNOSIS — F411 Generalized anxiety disorder: Secondary | ICD-10-CM

## 2022-11-19 DIAGNOSIS — F1010 Alcohol abuse, uncomplicated: Secondary | ICD-10-CM

## 2022-11-19 DIAGNOSIS — F4322 Adjustment disorder with anxiety: Secondary | ICD-10-CM

## 2022-11-19 DIAGNOSIS — Z630 Problems in relationship with spouse or partner: Secondary | ICD-10-CM | POA: Diagnosis not present

## 2022-11-19 NOTE — Progress Notes (Signed)
m  Date: 11/19/2022  Treatment Plan: Diagnosis F43.22 (Adjustment Disorder, With anxiety) [n/a]  V61.10 (Relationship distress with spouse or intimate partner) [n/a]  Symptoms Excessive and/or unrealistic worry that is difficult to control occurring more days than not for at least 6 months about a number of events or activities. (Status: maintained) -- No Description Entered  Frequent or continual arguing with the partner. (Status: maintained) -- No Description Entered  Marital separation. (Status: maintained) -- No Description Entered  Pending divorce. (Status: maintained) -- No Description Entered  Medication Status compliance  Safety none  If Suicidal or Homicidal State Action Taken: unspecified  Current Risk: low Medications Zoloft (Dosage: 75mg )  Objectives Related Problem: Increase awareness of own role in the relationship conflicts. Description: Gain insight into how past relationship experiences influence current relationship problems. Target Date: 2021-08-18 Frequency: Daily Modality: individual Progress: 100% complete Related Problem: Increase awareness of own role in the relationship conflicts. Description: Learn and implement problem-solving and conflict resolution skills. Target Date: 2023-02-18 Frequency: Daily Modality: individual Progress: 80%  Related Problem: Increase awareness of own role in the relationship conflicts. Description: Acknowledge the connection between substance abuse and the conflicts present within the relationship. Target Date: 2021-03-20 Frequency: Daily Modality: individual Progress: 100%  Related Problem: New Goal Statement for Anxiety Description: Maintain involvement in work, family, and social activities. Target Date:  2023-02-18 Frequency: Daily Modality: individual Progress: 90%  Related Problem: New Goal Statement for Anxiety Description: Learn and implement problem-solving strategies for realistically addressing worries. Target Date: 2023-02-18 Frequency: Daily Modality: individual Progress: 90%  Related Problem: New Goal Statement for Anxiety Description: Describe situations, thoughts, feelings, and actions associated with anxieties and worries, their impact on functioning, and attempts to resolve them. Target Date: 2023-02-18 Frequency: Daily Modality: individual Progress: 90%  Client Response full compliance  Service Location Location, 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 Alcohol abuse.  Meds: 10mg  Adderall.  Goals: He is seeking strategies to manage the complexities and stress associated with the discord with ex-wife. Co-parenting disagreements causes further distress. Seeking to improve communications that will minimze conflict and help him remain emotionally intact. Goal date is 12-24 He is also wanting to maintain his controlled drinking (not consuming alcohol in the presence of his children). Target 1-23. Completed  Patient agrees to have a video Caregility session and understands limitations of platform.Marland Kitchen He is at his office and I am in my home office.   Alex: States that his life is going "great" and he is thrilled with his relationship. Also, his kids and job are going well. Basil is doing a great  job of self-care. He and [name] have met with a pastor that will marry them. [name] continues to bring him considerable stress and he is  working to keep appropriate boundaries. [name] is committed to helping him with the issue of [name] and wants to be supportive. He and [name] are still working through permanent spousal support and need to get that legally settled. He is getting more in touch with his anger toward [place] with regard to her unreasonable requests/demands.  [name]'s ex boyfriend told [name] that [name] sometimes drives the kids intoxicated. He has no evidence, but will be mindful.                                                       [name], PhD Time: 2:10p-3:00p 50 min

## 2022-12-17 ENCOUNTER — Ambulatory Visit: Payer: 59 | Admitting: Psychology

## 2023-01-25 ENCOUNTER — Ambulatory Visit: Payer: 59 | Admitting: Psychology

## 2023-02-04 ENCOUNTER — Ambulatory Visit: Payer: 59 | Admitting: Psychology

## 2023-02-04 DIAGNOSIS — F1010 Alcohol abuse, uncomplicated: Secondary | ICD-10-CM

## 2023-02-04 DIAGNOSIS — F419 Anxiety disorder, unspecified: Secondary | ICD-10-CM

## 2023-02-04 DIAGNOSIS — F4322 Adjustment disorder with anxiety: Secondary | ICD-10-CM

## 2023-02-04 DIAGNOSIS — Z630 Problems in relationship with spouse or partner: Secondary | ICD-10-CM | POA: Diagnosis not present

## 2023-02-04 NOTE — Progress Notes (Signed)
m  Date: 02/04/2023  Treatment Plan: Diagnosis F43.22 (Adjustment Disorder, With anxiety) [n/a]  V61.10 (Relationship distress with spouse or intimate partner) [n/a]  Symptoms Excessive and/or unrealistic worry that is difficult to control occurring more days than not for at least 6 months about a number of events or activities. (Status: maintained) -- No Description Entered  Frequent or continual arguing with the partner. (Status: maintained) -- No Description Entered  Marital separation. (Status: maintained) -- No Description Entered  Pending divorce. (Status: maintained) -- No Description Entered  Medication Status compliance  Safety none  If Suicidal or Homicidal State Action Taken: unspecified  Current Risk: low Medications Zoloft (Dosage: 75mg )  Objectives Related Problem: Increase awareness of own role in the relationship conflicts. Description: Gain insight into how past relationship experiences influence current relationship problems. Target Date: 2021-08-18 Frequency: Daily Modality: individual Progress: 100% complete Related Problem: Increase awareness of own role in the relationship conflicts. Description: Learn and implement problem-solving and conflict resolution skills. Target Date: 2023-02-18 Frequency: Daily Modality: individual Progress: 80%  Related Problem: Increase awareness of own role in the relationship conflicts. Description: Acknowledge the connection between substance abuse and the conflicts present within the relationship. Target Date: 2021-03-20 Frequency: Daily Modality: individual Progress: 100%  Related Problem: New Goal Statement for Anxiety Description: Maintain involvement in work, family, and social  activities. Target Date: 2023-02-18 Frequency: Daily Modality: individual Progress: 90%  Related Problem: New Goal Statement for Anxiety Description: Learn and implement problem-solving strategies for realistically addressing worries. Target Date: 2023-02-18 Frequency: Daily Modality: individual Progress: 90%  Related Problem: New Goal Statement for Anxiety Description: Describe situations, thoughts, feelings, and actions associated with anxieties and worries, their impact on functioning, and attempts to resolve them. Target Date: 2023-02-18 Frequency: Daily Modality: individual Progress: 90%  Client Response full compliance  Service Location Location, 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 Alcohol abuse.  Meds: 10mg  Adderall.  Goals: He is seeking strategies to manage the complexities and stress associated with the discord with ex-wife. Co-parenting disagreements causes further distress. Seeking to improve communications that will minimze conflict and help him remain emotionally intact. Goal date is 12-24 He is also wanting to maintain his controlled drinking (not consuming alcohol in the presence of his children). Target 1-23. Completed  Patient agrees to have a video Caregility session and understands limitations of platform.Marland Kitchen He is at his office and I am in my home office.   Erik King: States that he and his ex-wife have alimony mediation this morning. He  has been stressed but feels in a good space this morning. We talked about the best way to approach the negotiations his morning and not let "emotions get in the way". He has a good  and realistic perspective of how this will play out today. His marriage will Dec. 28th. And he is very excited. His attorney suggested that he do a prenup and he is conflicted. He says he does not anticipate they will ever have marital problems, but struggles with how to move forward. He has talked to Erik King about it and she is not pushing back on it, but "wants it to be fair". He has talked with his attorney about it being fair. His attorney wrote a 20 page agreement, but Erik King feels it is overly in his favor. He is going to ask attorney to revise. Discussed the best way to negotiate a document that will satisfy all parties involved. He is managing his anxiety well and maintaining a positive attitude.                                                             Erik Ridgel, PhD Time: 2:10p-3:00p 50 min

## 2023-02-08 ENCOUNTER — Ambulatory Visit: Payer: 59 | Admitting: Psychology

## 2023-02-25 ENCOUNTER — Ambulatory Visit: Payer: 59 | Admitting: Psychology

## 2023-02-27 ENCOUNTER — Ambulatory Visit: Payer: 59 | Admitting: Psychology

## 2023-02-27 DIAGNOSIS — F419 Anxiety disorder, unspecified: Secondary | ICD-10-CM | POA: Diagnosis not present

## 2023-02-27 DIAGNOSIS — F1010 Alcohol abuse, uncomplicated: Secondary | ICD-10-CM | POA: Diagnosis not present

## 2023-02-27 DIAGNOSIS — F4322 Adjustment disorder with anxiety: Secondary | ICD-10-CM

## 2023-02-27 DIAGNOSIS — Z630 Problems in relationship with spouse or partner: Secondary | ICD-10-CM | POA: Diagnosis not present

## 2023-02-27 NOTE — Progress Notes (Signed)
m  Date: 02/27/2023  Treatment Plan: Diagnosis F43.22 (Adjustment Disorder, With anxiety) [n/a]  V61.10 (Relationship distress with spouse or intimate partner) [n/a]  Symptoms Excessive and/or unrealistic worry that is difficult to control occurring more days than not for at least 6 months about a number of events or activities. (Status: maintained) -- No Description Entered  Frequent or continual arguing with the partner. (Status: maintained) -- No Description Entered  Marital separation. (Status: maintained) -- No Description Entered  Pending divorce. (Status: maintained) -- No Description Entered  Medication Status compliance  Safety none  If Suicidal or Homicidal State Action Taken: unspecified  Current Risk: low Medications Zoloft (Dosage: 75mg )  Objectives Related Problem: Increase awareness of own role in the relationship conflicts. Description: Gain insight into how past relationship experiences influence current relationship problems. Target Date: 2021-08-18 Frequency: Daily Modality: individual Progress: 100% complete Related Problem: Increase awareness of own role in the relationship conflicts. Description: Learn and implement problem-solving and conflict resolution skills. Target Date: 2024-02-18 Frequency: Daily Modality: individual Progress: 90%  Related Problem: Increase awareness of own role in the relationship conflicts. Description: Acknowledge the connection between substance abuse and the conflicts present within the relationship. Target Date: 2021-03-20 Frequency: Daily Modality: individual Progress: 100%  Related Problem: New Goal Statement for Anxiety Description: Maintain involvement in  work, family, and social activities. Target Date: 2024-02-18 Frequency: Daily Modality: individual Progress: 95%  Related Problem: New Goal Statement for Anxiety Description: Learn and implement problem-solving strategies for realistically addressing worries. Target Date: 2024-02-18 Frequency: Daily Modality: individual Progress: 90%  Related Problem: New Goal Statement for Anxiety Description: Describe situations, thoughts, feelings, and actions associated with anxieties and worries, their impact on functioning, and attempts to resolve them. Target Date: 2023-02-18 Frequency: Daily Modality: individual Progress: 100%  Client Response full compliance  Service Location Location, 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 Alcohol abuse.  Meds: 10mg  Adderall.  Goals: He is seeking strategies to manage the complexities and stress associated with the discord with ex-wife. Co-parenting disagreements causes further distress. Seeking to improve communications that will minimze conflict and help him remain emotionally intact. Goal date is 12-25 He is also wanting to maintain his controlled drinking (not consuming alcohol in the presence of his children). Target 1-23. Completed  He is seeking strategies to facilitate the new family composition as he gets remarried Dec. 28th. Wants to understand best strategies for parenting and communications  with ex-spouse.  Patient agrees to have a Engineering geologist session and understands limitations of platform.Marland Kitchen He is at his office and I am in my home office.   Erik King: States that his wedding is in 10 days and  "everything looks good". Erik King continues to pressure Erik King to pay for things even though they have settled finances. We discussed how to talk with the kids regarding the significant parental alienation. He is avoiding confrontation with ex-wife and does not express any of his upset in front of his children. Reports being very happy and excited for his future.                                                                 [name], PhD Time: 12:10p-1:00p 50 min

## 2023-03-25 ENCOUNTER — Ambulatory Visit: Payer: 59 | Admitting: Psychology

## 2023-04-12 ENCOUNTER — Ambulatory Visit (INDEPENDENT_AMBULATORY_CARE_PROVIDER_SITE_OTHER): Payer: 59 | Admitting: Psychology

## 2023-04-12 DIAGNOSIS — F4322 Adjustment disorder with anxiety: Secondary | ICD-10-CM

## 2023-04-12 DIAGNOSIS — Z630 Problems in relationship with spouse or partner: Secondary | ICD-10-CM | POA: Diagnosis not present

## 2023-04-12 NOTE — Progress Notes (Signed)
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 (Status: maintained) -- No Description Entered  Frequent or continual arguing with the partner. (Status: maintained) -- No Description Entered  Marital separation. (Status: maintained) -- No Description Entered  Pending divorce. (Status: maintained) -- No Description Entered  Medication Status compliance  Safety none  If Suicidal or Homicidal State Action Taken: unspecified  Current Risk: low Medications Zoloft (Dosage: 75mg )  Objectives Related Problem: Increase awareness of own role in the relationship conflicts. Description: Gain insight into how past relationship experiences influence current relationship problems. Target Date: 2021-08-18 Frequency: Daily Modality: individual Progress: 100% complete Related Problem: Increase awareness of own role in the relationship conflicts. Description: Learn and implement problem-solving and conflict resolution skills. Target Date: 2024-02-18 Frequency: Daily Modality: individual Progress: 90%  Related Problem: Increase awareness of own role in the relationship conflicts. Description: Acknowledge the connection between substance abuse and the conflicts present within the relationship. Target Date: 2021-03-20 Frequency: Daily Modality: individual Progress: 100%  Related Problem: New Goal Statement for  Anxiety Description: Maintain involvement in work, family, and social activities. Target Date: 2024-02-18 Frequency: Daily Modality: individual Progress: 95%  Related Problem: New Goal Statement for Anxiety Description: Learn and implement problem-solving strategies for realistically addressing worries. Target Date: 2024-02-18 Frequency: Daily Modality: individual Progress: 90%  Related Problem: New Goal Statement for Anxiety Description: Describe situations, thoughts, feelings, and actions associated with anxieties and worries, their impact on functioning, and attempts to resolve them. Target Date: 2023-02-18 Frequency: Daily Modality: individual Progress: 100%  Client Response full compliance  Service Location Location, 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 Alcohol abuse.  Meds: 10mg  Adderall.  Goals: He is seeking strategies to manage the complexities and stress associated with the discord with ex-wife. Co-parenting disagreements causes further distress. Seeking to improve communications that will minimze conflict and help him remain emotionally intact. Goal date is 12-25 He is also wanting to maintain his controlled drinking (not consuming alcohol in the presence of his children). Target 1-23. Completed  He is seeking strategies to facilitate the new family composition  as he gets remarried Dec. 28th. Wants to understand best strategies for parenting and communications with ex-spouse.  Patient agrees to have a Engineering geologist session and understands limitations of platform.Marland Kitchen He is at his office and I am in my home office.    Erik King: He states that the wedding went well and the kids all participated. No issues arose that caused any problems. Since then, things have "been a little crazy" with his ex. He and Erik King are doing great and feel a lot a lot of gratitude. Erik King reached out to Erik King to offer her cell phone for better communication. Erik King used it as opportunity to be critical of Erik King, so Erik King ultimately blocked her number. Erik King is continually hostile and controlling. We talked about de-escalating situations with Erik King. Erik King has a boyfriend. He says that he is always having to be cautious and on edge.  He says that he and his brother have not been close in many years. 14 years ago, they had a big argument and his brother cut him off. His brother will go a year at a time without talking to parents. About 4 months ago, his mother said that his brother told her that Erik King never reaches out. Erik King met Erik King for coffee and it went well, and since then they have gotten together a few times with spouses. He invited brother and his wife to the wedding and they agreed to come. Erik King had not met 3 of his 4 kids and he invited them over to meet his kids. They did come to wedding and they are building back a relationship. Erik King still holds lots of anger toward their parents. After wedding, Erik King took whole family to parent's home in [place]. His parents told him that many years ago, Erik King called his parents to ask for $25,000 for in vitro fertilization and they said no. Feeling was that she had been pregnant 6 times and had miscarriages. Erik King is still very bitter toward parents.                                                                    Erik Ridgel, PhD Time: 10:40a-11:30a 50 min

## 2023-05-13 ENCOUNTER — Ambulatory Visit: Payer: 59 | Admitting: Psychology

## 2023-05-13 DIAGNOSIS — F4322 Adjustment disorder with anxiety: Secondary | ICD-10-CM | POA: Diagnosis not present

## 2023-05-13 NOTE — Progress Notes (Signed)
 m  Date: 05/13/2023  Treatment Plan: Diagnosis F43.22 (Adjustment Disorder, With anxiety) [n/a]  V61.10 (Relationship distress with spouse or intimate partner) [n/a]  Symptoms Excessive and/or unrealistic worry that is difficult to control occurring more days than not for at least 6 months about a number of events or activities. (Status: maintained) -- No Description Entered  Frequent or continual arguing with the partner. (Status: maintained) -- No Description Entered  Marital separation. (Status: maintained) -- No Description Entered  Pending divorce. (Status: maintained) -- No Description Entered  Medication Status compliance  Safety none  If Suicidal or Homicidal State Action Taken: unspecified  Current Risk: low Medications Zoloft (Dosage: 75mg )  Objectives Related Problem: Increase awareness of own role in the relationship conflicts. Description: Gain insight into how past relationship experiences influence current relationship problems. Target Date: 2021-08-18 Frequency: Daily Modality: individual Progress: 100% complete Related Problem: Increase awareness of own role in the relationship conflicts. Description: Learn and implement problem-solving and conflict resolution skills. Target Date: 2024-02-18 Frequency: Daily Modality: individual Progress: 90%  Related Problem: Increase awareness of own role in the relationship conflicts. Description: Acknowledge the connection between substance abuse and the conflicts present within the relationship. Target Date: 2021-03-20 Frequency: Daily Modality: individual Progress: 100%  Related Problem: New Goal  Statement for Anxiety Description: Maintain involvement in work, family, and social activities. Target Date: 2024-02-18 Frequency: Daily Modality: individual Progress: 95%  Related Problem: New Goal Statement for Anxiety Description: Learn and implement problem-solving strategies for realistically addressing worries. Target Date: 2024-02-18 Frequency: Daily Modality: individual Progress: 90%  Related Problem: New Goal Statement for Anxiety Description: Describe situations, thoughts, feelings, and actions associated with anxieties and worries, their impact on functioning, and attempts to resolve them. Target Date: 2023-02-18 Frequency: Daily Modality: individual Progress: 100%  Client Response full compliance  Service Location Location, 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 Alcohol abuse.  Meds: 10mg  Adderall.  Goals: He is seeking strategies to manage the complexities and stress associated with the discord with ex-wife. Co-parenting disagreements causes further distress. Seeking to improve communications that will minimze conflict and help him remain emotionally intact. Goal date is 12-25 He is also wanting to maintain his controlled drinking (not consuming alcohol in the presence of his  children). Target 1-23. Completed  He is seeking strategies to facilitate the new family composition as he gets remarried Dec. 28th. Wants to understand best strategies for parenting and communications with ex-spouse.  Patient agrees to have a Engineering geologist session and understands limitations of platform.Marland Kitchen He is at his office and I am in my home  office.   Erik King: States that life is great with his wife and kids. All are adjusting well and there have not been problems. The only situation that creates problems and anxiety is with his ex-wife [name]. Work stress is minor compared to the chaos that [place] creates. His son is now taking boxing lessons and isn't sure if [name] with support this activity. There is little cooperation between them. Erik King clearly says he is handling the situation with [name] better than ever before.                                                                     Erik Ridgel, PhD Time: 2:10p-3:00p 50 min

## 2023-06-17 ENCOUNTER — Ambulatory Visit (INDEPENDENT_AMBULATORY_CARE_PROVIDER_SITE_OTHER): Payer: 59 | Admitting: Psychology

## 2023-06-17 DIAGNOSIS — Z7189 Other specified counseling: Secondary | ICD-10-CM

## 2023-06-17 DIAGNOSIS — F4322 Adjustment disorder with anxiety: Secondary | ICD-10-CM

## 2023-06-17 NOTE — Progress Notes (Signed)
 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 (Status: maintained) -- No Description Entered  Frequent or continual arguing with the partner. (Status: maintained) -- No Description Entered  Marital separation. (Status: maintained) -- No Description Entered  Pending divorce. (Status: maintained) -- No Description Entered  Medication Status compliance  Safety none  If Suicidal or Homicidal State Action Taken: unspecified  Current Risk: low Medications Zoloft (Dosage: 75mg )  Objectives Related Problem: Increase awareness of own role in the relationship conflicts. Description: Gain insight into how past relationship experiences influence current relationship problems. Target Date: 2021-08-18 Frequency: Daily Modality: individual Progress: 100% complete Related Problem: Increase awareness of own role in the relationship conflicts. Description: Learn and implement problem-solving and conflict resolution skills. Target Date: 2024-02-18 Frequency: Daily Modality: individual Progress: 90%  Related Problem: Increase awareness of own role in the relationship conflicts. Description: Acknowledge the connection between substance abuse and the conflicts present within the relationship. Target Date: 2021-03-20 Frequency: Daily Modality: individual Progress: 100%   Related Problem: New Goal Statement for Anxiety Description: Maintain involvement in work, family, and social activities. Target Date: 2024-02-18 Frequency: Daily Modality: individual Progress: 95%  Related Problem: New Goal Statement for Anxiety Description: Learn and implement problem-solving strategies for realistically addressing worries. Target Date: 2024-02-18 Frequency: Daily Modality: individual Progress: 90%  Related Problem: New Goal Statement for Anxiety Description: Describe situations, thoughts, feelings, and actions associated with anxieties and worries, their impact on functioning, and attempts to resolve them. Target Date: 2023-02-18 Frequency: Daily Modality: individual Progress: 100%  Client Response full compliance  Service Location Location, 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 Alcohol abuse.  Meds: 10mg  Adderall.  Goals: He is seeking strategies to manage the complexities and stress associated with the discord with ex-wife. Co-parenting disagreements causes further distress. Seeking to improve communications that will minimze conflict and help him remain emotionally intact. Goal date is 12-25 He is  also wanting to maintain his controlled drinking (not consuming alcohol in the presence of his children). Target 1-23. Completed  He is seeking strategies to facilitate the new family composition as he gets remarried Dec. 28th. Wants to understand best strategies for parenting and communications with ex-spouse.  Patient agrees to have a Engineering geologist session and understands limitations of platform.Marland Kitchen He is at his  office and I am in my home office.   Erik King: States that life with [name] continues to be great. He says their jobs are very stable and she in in process of selling her house. They may look for a new home once her house sells. His oldest son will start driving this summer and [name] bought him a used car. 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 Parents do not put Erik King in the middle.  We talked about ending our sessions and leaving things in a place where he will call me if he needs help with any situation. He has accomplished his goals and feels that he is in a stable place in his life at this time.                                                                        [name], PhD Time: 2:10p-3:00p 50 min
